# Patient Record
Sex: Female | Born: 1970 | Race: White | Hispanic: No | Marital: Married | Smoking: Former smoker
Health system: Southern US, Community
[De-identification: ages and names within clinical notes are randomized; demographics above are authoritative.]

## PROBLEM LIST (undated history)

## (undated) DIAGNOSIS — M719 Bursopathy, unspecified: Secondary | ICD-10-CM

## (undated) DIAGNOSIS — M519 Unspecified thoracic, thoracolumbar and lumbosacral intervertebral disc disorder: Secondary | ICD-10-CM

## (undated) HISTORY — DX: Bursopathy, unspecified: M71.9

## (undated) HISTORY — DX: Unspecified thoracic, thoracolumbar and lumbosacral intervertebral disc disorder: M51.9

---

## 2001-03-21 HISTORY — PX: DILATION AND CURETTAGE OF UTERUS: SHX78

## 2002-03-21 HISTORY — PX: LAPAROSCOPY: SHX197

## 2004-01-02 ENCOUNTER — Ambulatory Visit (HOSPITAL_COMMUNITY): Admission: RE | Admit: 2004-01-02 | Discharge: 2004-01-02 | Payer: Self-pay | Admitting: Gynecology

## 2004-02-20 ENCOUNTER — Ambulatory Visit (HOSPITAL_COMMUNITY): Admission: RE | Admit: 2004-02-20 | Discharge: 2004-02-20 | Payer: Self-pay | Admitting: Gynecology

## 2004-05-17 ENCOUNTER — Ambulatory Visit (HOSPITAL_COMMUNITY): Admission: RE | Admit: 2004-05-17 | Discharge: 2004-05-17 | Payer: Self-pay | Admitting: Gynecology

## 2004-05-27 ENCOUNTER — Inpatient Hospital Stay (HOSPITAL_COMMUNITY): Admission: AD | Admit: 2004-05-27 | Discharge: 2004-05-30 | Payer: Self-pay | Admitting: Gynecology

## 2004-05-31 ENCOUNTER — Encounter: Admission: RE | Admit: 2004-05-31 | Discharge: 2004-06-30 | Payer: Self-pay | Admitting: Gynecology

## 2004-07-01 ENCOUNTER — Encounter: Admission: RE | Admit: 2004-07-01 | Discharge: 2004-07-14 | Payer: Self-pay | Admitting: Gynecology

## 2004-12-06 IMAGING — US US OB FOLLOW-UP
1 series · 13 of 28 positions shown · non-contrast
Comparison: none

CLINICAL DATA: 25 week 2 day assigned gestational age.  Incomplete anatomic evaluation.  Follow-up fetal anatomy and evaluate growth.

[Series 1: us ob follow-up · 0.33mm/px · 13 of 62 slices shown]
[im 3/62]
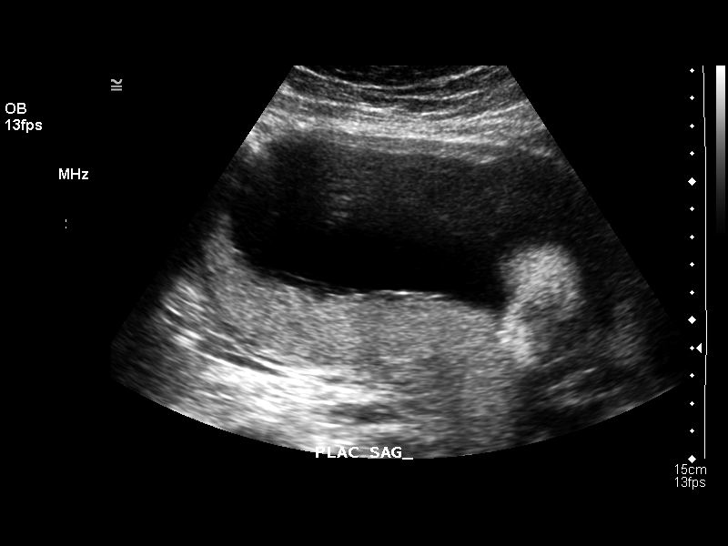
[im 7/62]
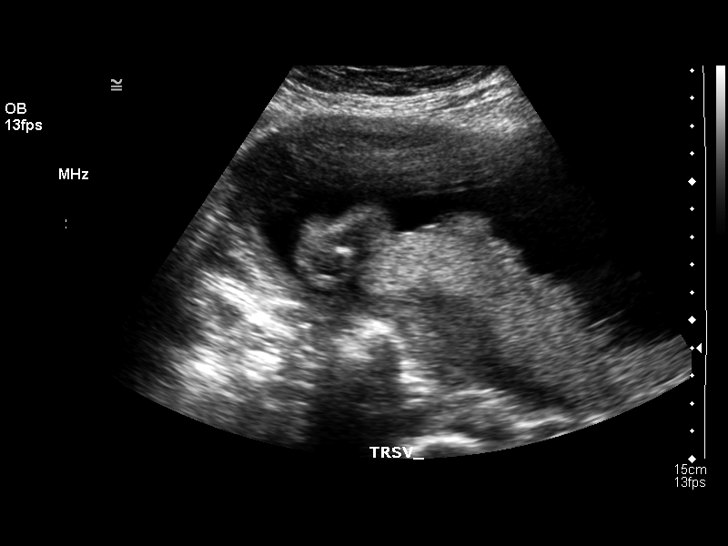
[im 12/62]
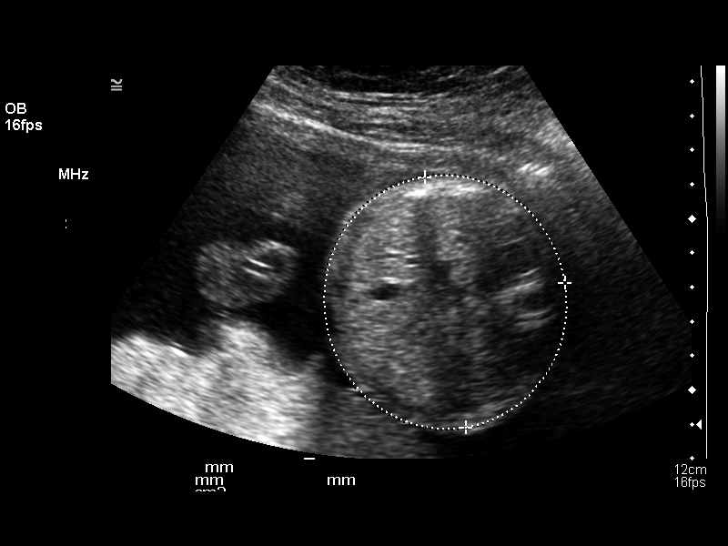
[im 16/62]
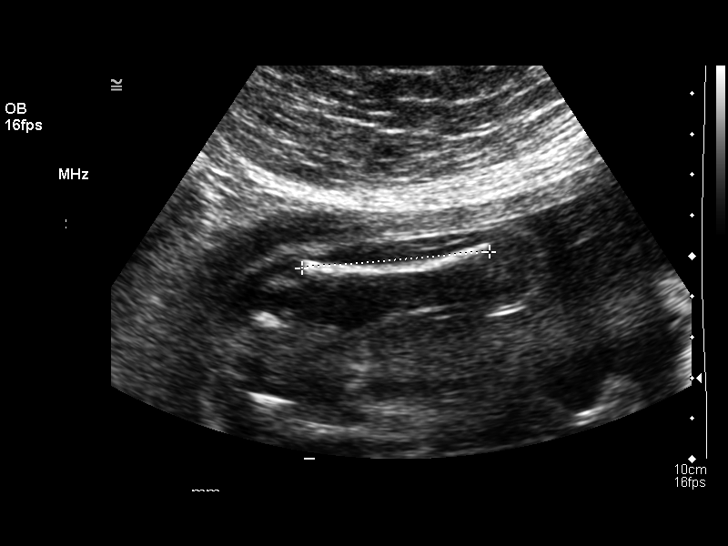
[im 21/62]
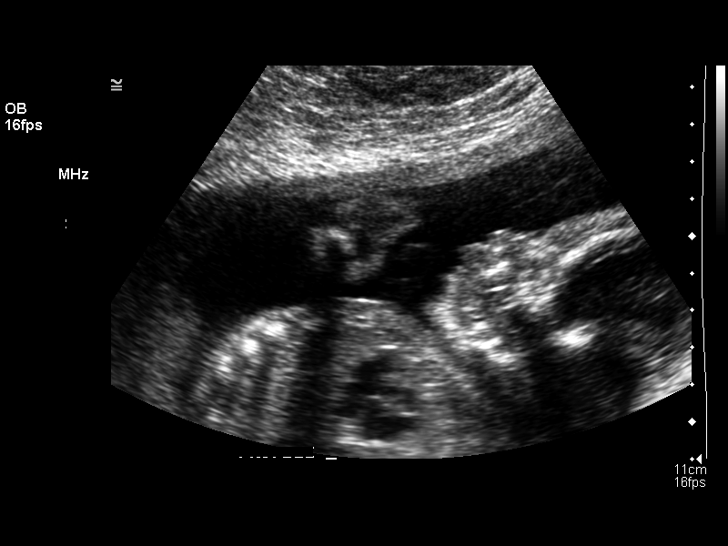
[im 25/62]
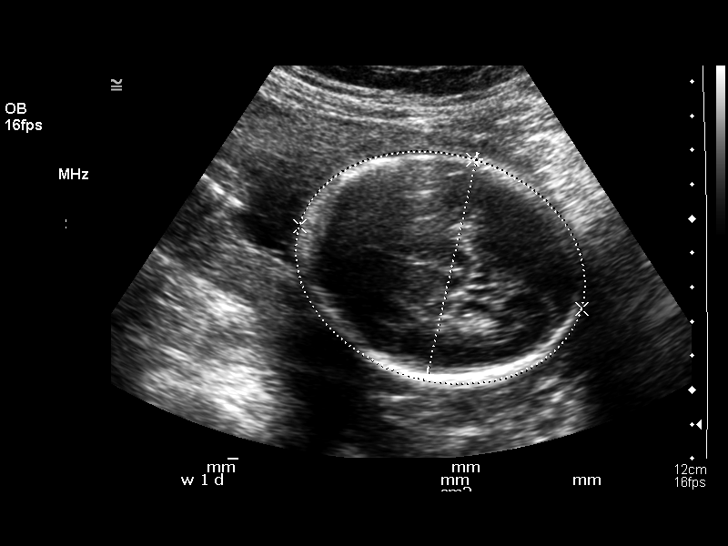
[im 32/62]
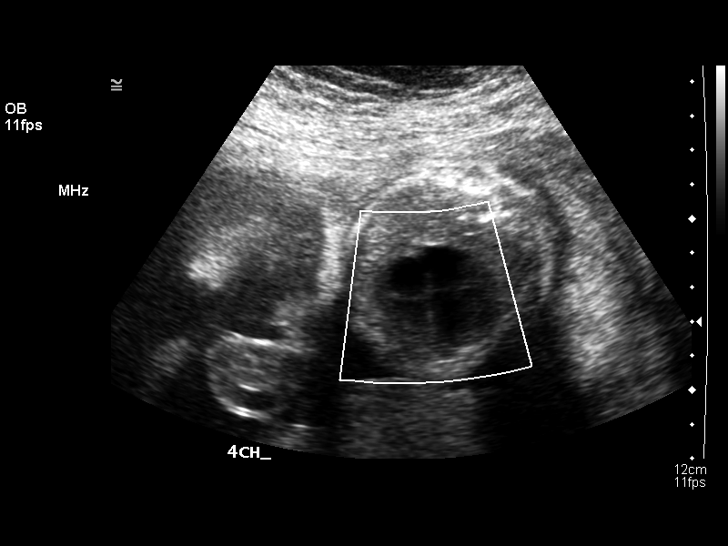
[im 37/62]
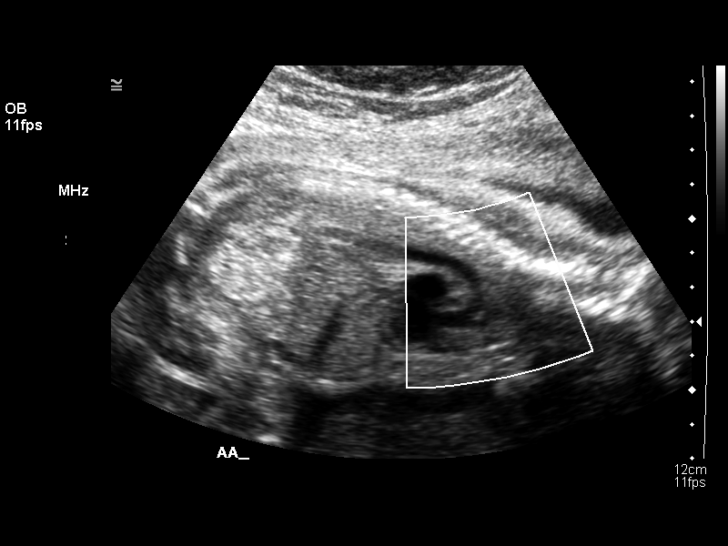
[im 41/62]
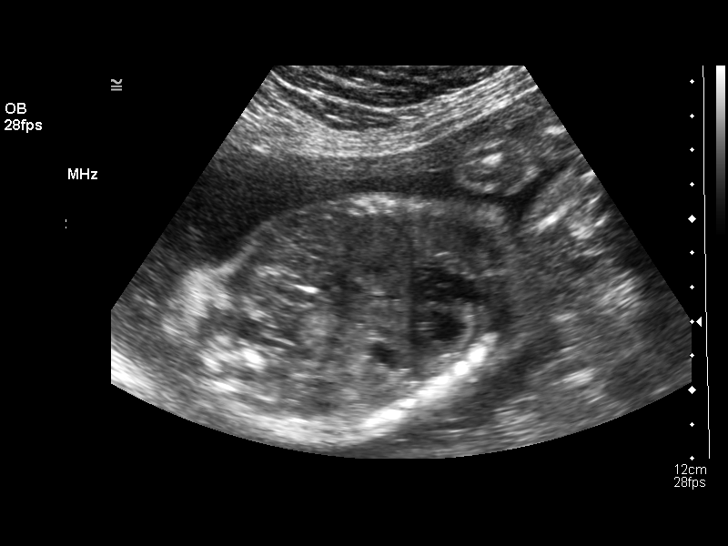
[im 46/62]
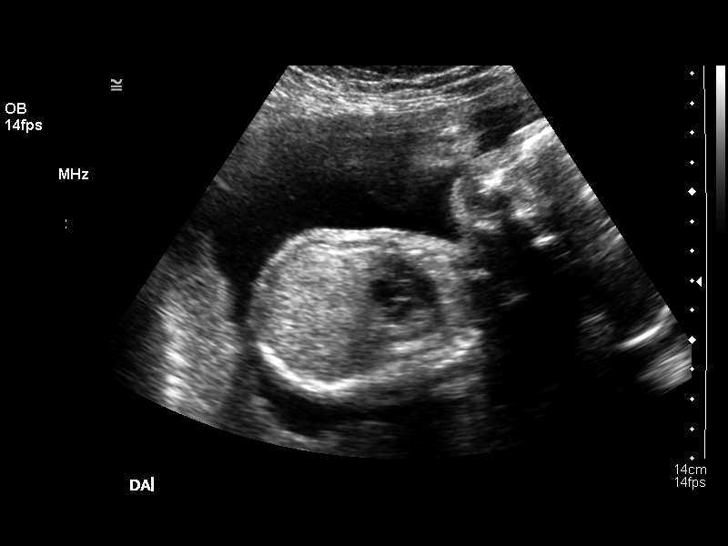
[im 50/62]
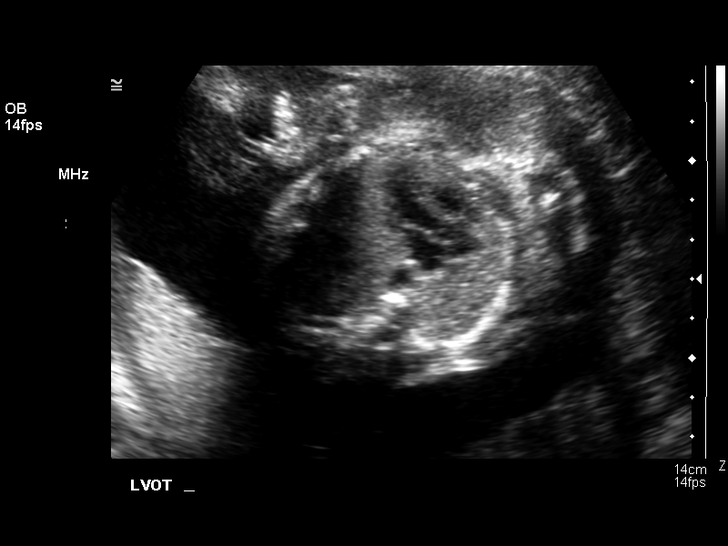
[im 55/62]
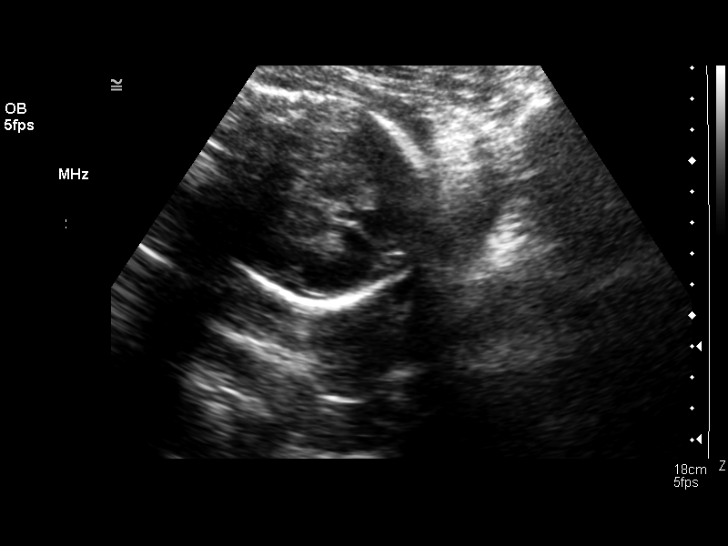
[im 59/62]
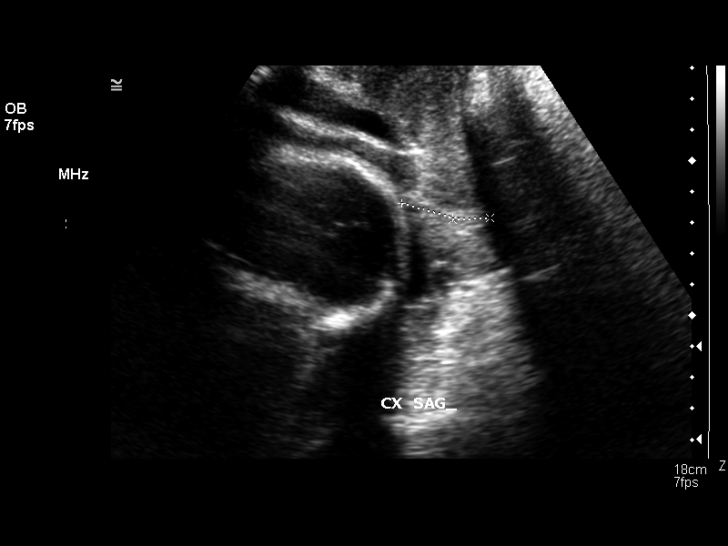

[13 of 28 positions shown; findings below may reference images not displayed]

OBSTETRICAL ULTRASOUND RE-EVALUATION:
 Number of Fetuses:  1
 Heart Rate:  126
 Movement:  Yes
 Breathing:  Yes
 Presentation:  Cephalic
 Placental Location:  Posterior
 Grade:  I
 Previa:  No
 Amniotic Fluid (subjective):  Normal
 Amniotic Fluid (objective):  5.0 cm Vertical pocket 

 FETAL BIOMETRY
 BPD:  6.4 cm  26 w 1 d 
 HC:  23.5 cm  25 w 4 d
 AC:  22.9 cm   27 w 2 d
 FL:   4.5 cm   25 w 2 d

 Mean GA:  26 w 1 d
 Assigned GA:  25 w 2 d

 EFW:  923 g (H) 90th – 95th%ile ( 889 – 968 g) for 25 wks

 FETAL ANATOMY
 Lateral Ventricles:  Visualized 
 Thalami/CSP:  Previously seen 
 Posterior Fossa:  Previously seen 
 Nuchal Region:  Previously seen 
 Spine:  Previously seen 
 4 Chamber Heart on Left:  Visualized 
 Stomach on Left:  Visualized 
 3 Vessel Cord:  Previously seen 
 Cord Insertion Site:  Previously seen 
 Kidneys:  Visualized 
 Bladder:  Visualized 
 Extremities:  Previously seen 

 ADDITIONAL ANATOMY VISUALIZED:  LVOT, RVOT, profile, ductal arch, and male genitalia.  

 MATERNAL UTERINE AND ADNEXAL FINDINGS
 Cervix:  3.0 cm Translabially
IMPRESSION: 1.  Assigned gestational age is currently 25 weeks 2 days.  Appropriate fetal growth is seen, with EFW currently at 90th – 95th percentile.  Consider further ultrasound follow-up of fetal growth.  
 2.  Four chamber heart and cardiac outflow tracts were visualized on today’s exam.  No fetal abnormalities are identified.  
 3.  Normal amniotic fluid volume and normal cervical length.

## 2005-05-13 DIAGNOSIS — F329 Major depressive disorder, single episode, unspecified: Secondary | ICD-10-CM | POA: Insufficient documentation

## 2005-05-13 DIAGNOSIS — R5381 Other malaise: Secondary | ICD-10-CM | POA: Insufficient documentation

## 2005-05-13 DIAGNOSIS — F32A Depression, unspecified: Secondary | ICD-10-CM | POA: Insufficient documentation

## 2005-08-24 ENCOUNTER — Ambulatory Visit: Payer: Self-pay | Admitting: Gynecology

## 2005-11-28 ENCOUNTER — Encounter (INDEPENDENT_AMBULATORY_CARE_PROVIDER_SITE_OTHER): Payer: Self-pay | Admitting: Gynecology

## 2005-11-28 ENCOUNTER — Ambulatory Visit: Payer: Self-pay | Admitting: Gynecology

## 2006-05-18 ENCOUNTER — Ambulatory Visit: Payer: Self-pay | Admitting: Gynecology

## 2006-09-18 ENCOUNTER — Ambulatory Visit: Payer: Self-pay | Admitting: Family Medicine

## 2006-10-24 ENCOUNTER — Ambulatory Visit: Payer: Self-pay | Admitting: Neurosurgery

## 2006-11-13 ENCOUNTER — Ambulatory Visit: Payer: Self-pay | Admitting: Gynecology

## 2007-01-01 ENCOUNTER — Ambulatory Visit: Payer: Self-pay | Admitting: Gynecology

## 2007-03-12 ENCOUNTER — Inpatient Hospital Stay (HOSPITAL_COMMUNITY): Admission: AD | Admit: 2007-03-12 | Discharge: 2007-03-12 | Payer: Self-pay | Admitting: Gynecology

## 2007-04-23 ENCOUNTER — Ambulatory Visit: Payer: Self-pay | Admitting: Gynecology

## 2008-05-27 ENCOUNTER — Ambulatory Visit: Payer: Self-pay | Admitting: Obstetrics & Gynecology

## 2010-04-11 ENCOUNTER — Encounter: Payer: Self-pay | Admitting: Gynecology

## 2010-04-27 ENCOUNTER — Ambulatory Visit: Payer: Self-pay

## 2010-08-03 NOTE — Assessment & Plan Note (Signed)
NAMESEHAM, GARDENHIRE                 ACCOUNT NO.:  [id number]   MEDICAL RECORD NO.:  [id number]          PATIENT TYPE:  POB   LOCATION:  [facility]         FACILITY:  [facility]   PHYSICIAN:  Scheryl Darter, MD       DATE OF BIRTH:  1971-01-13   DATE OF SERVICE:  05/27/2008                                  CLINIC NOTE   The patient comes on March, 9, 2010.  The patient presents for yearly  exam.   Ms. Schlemmer is a 40 year old white female gravida 1, para 1 with a  history of irregular periods, infertility, polycystic ovary syndrome.  She is currently on no contraception.  She required in vitro  fertilization in order to conceive.  Her son is now 33 years old.  She  has not desired infertility treatment again.  She has been amenorrheic  since March 22, 2007, although she says that sometime in the last year  she had some slight spotting.  She asked today about a weight loss  medicine called Bontril that she has heard of.   PAST MEDICAL HISTORY:  1. Obesity.  2. Infertility with PCOS.   PAST SURGICAL HISTORY:  None.   MEDICATIONS:  None.   ALLERGIES:  SULFA.   SOCIAL HISTORY:  The patient is married.  She denies alcohol, tobacco,  or drug use.   FAMILY HISTORY:  Unremarkable.   REVIEW OF SYSTEMS:  Amenorrhea.  She denies urinary or vaginal symptoms.   PHYSICAL EXAMINATION:  GENERAL:  The patient is in no acute distress.  Normal affect.  VITAL SIGNS:  Weight is 221 pounds, height 5 feet 4 inches, BP 113/74,  pulse 96.  CHEST:  Clear.  HEART:  Regular rate and rhythm.  BREASTS:  Without masses.  ABDOMEN:  Mildly obese, soft, nontender.  No masses.  EXTREMITIES:  No swelling.  PELVIC:  External genitalia, vagina, and cervix are normal.  Pap was  obtained.  Uterus, normal size.  No adnexal mass or tenderness.   IMPRESSION:  History of chronic anovulation, currently anovulatory.  She  does not plan to conceive at this time.  She states that she had to have  in vitro  fertilization previously.   PLAN:  Offered cycle control with oral contraceptive pill and she would  like to start this.  I gave prescription for Ortho Tri-Cyclen.  She  plans to lose weight and she started to exercise more and improve her  diet.  I did not recommend being on weight loss medication.      Scheryl Darter, MD     JA/MEDQ  D:  05/27/2008  T:  05/28/2008  Job:  [id number]

## 2010-08-17 ENCOUNTER — Ambulatory Visit: Payer: Self-pay | Admitting: Family Medicine

## 2010-12-24 LAB — URINALYSIS, ROUTINE W REFLEX MICROSCOPIC
Bilirubin Urine: NEGATIVE
Glucose, UA: NEGATIVE
Hgb urine dipstick: NEGATIVE
Ketones, ur: NEGATIVE
Nitrite: NEGATIVE
Protein, ur: NEGATIVE
Specific Gravity, Urine: 1.01
Urobilinogen, UA: 0.2
pH: 6.5

## 2010-12-24 LAB — DIFFERENTIAL
Basophils Absolute: 0
Basophils Relative: 1
Eosinophils Absolute: 0.3
Eosinophils Relative: 5
Lymphocytes Relative: 31
Lymphs Abs: 1.9
Monocytes Absolute: 0.4
Monocytes Relative: 7
Neutro Abs: 3.5
Neutrophils Relative %: 57

## 2010-12-24 LAB — CBC
HCT: 31.5 — ABNORMAL LOW
Hemoglobin: 10.9 — ABNORMAL LOW
MCHC: 34.7
MCV: 88.6
Platelets: 307
RBC: 3.55 — ABNORMAL LOW
RDW: 12.8
WBC: 6.2

## 2010-12-24 LAB — WET PREP, GENITAL
Clue Cells Wet Prep HPF POC: NONE SEEN
Trich, Wet Prep: NONE SEEN
Yeast Wet Prep HPF POC: NONE SEEN

## 2010-12-24 LAB — POCT PREGNANCY, URINE
Operator id: 220991
Preg Test, Ur: NEGATIVE

## 2010-12-24 LAB — GC/CHLAMYDIA PROBE AMP, GENITAL
Chlamydia, DNA Probe: NEGATIVE
GC Probe Amp, Genital: NEGATIVE

## 2012-10-31 LAB — HM PAP SMEAR: HM Pap smear: NEGATIVE

## 2012-11-13 LAB — HM HIV SCREENING LAB: HM HIV SCREENING: NEGATIVE

## 2012-11-14 ENCOUNTER — Ambulatory Visit: Payer: Self-pay | Admitting: Obstetrics and Gynecology

## 2012-12-11 ENCOUNTER — Ambulatory Visit: Payer: Self-pay | Admitting: Obstetrics and Gynecology

## 2012-12-11 LAB — PREGNANCY, URINE: Pregnancy Test, Urine: NEGATIVE m[IU]/mL

## 2013-09-27 IMAGING — US ULTRASOUND CORE BIOPSY
1 series · 11 of 11 positions shown · non-contrast
Comparison: none

REASON FOR EXAM: rt thyroid nodule
COMMENTS:

[Series 1: ultrasound core biopsy · 0.08mm/px · 11 of 11 slices shown]
[im 1/11]
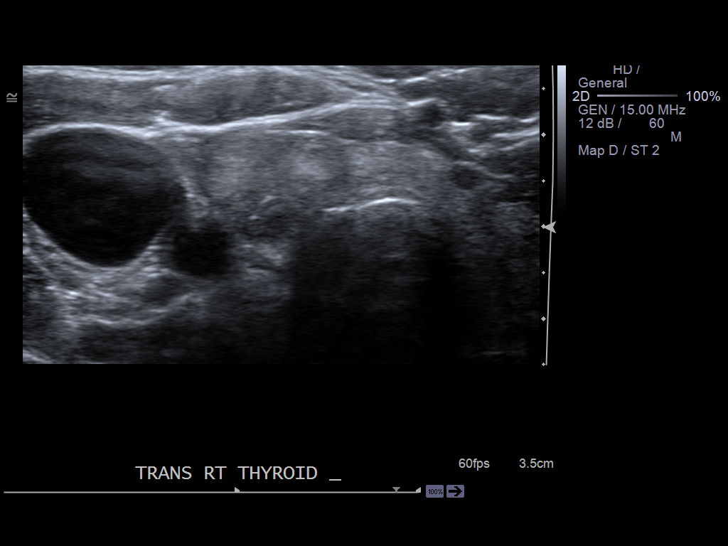
[im 2/11]
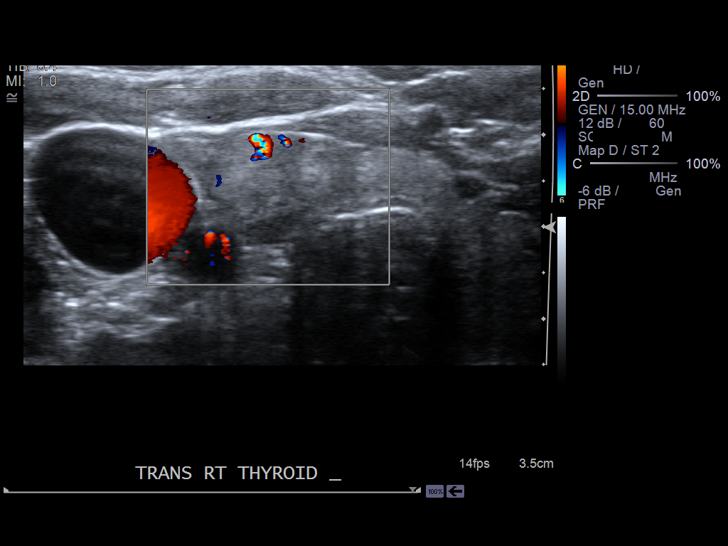
[im 3/11]
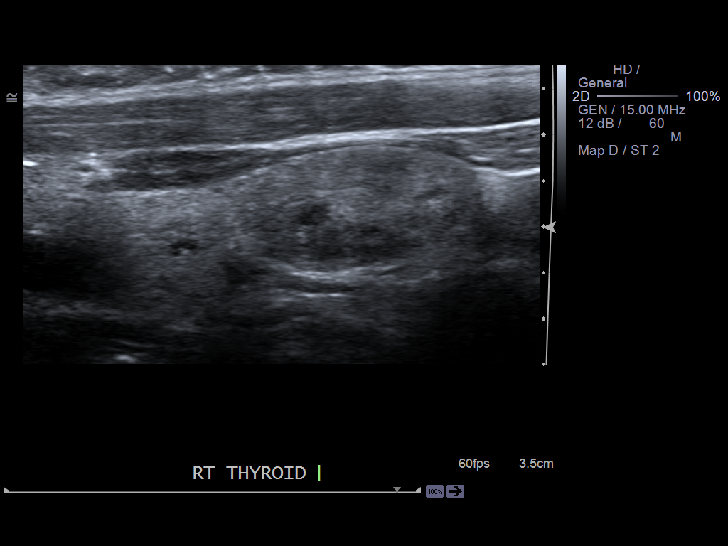
[im 4/11]
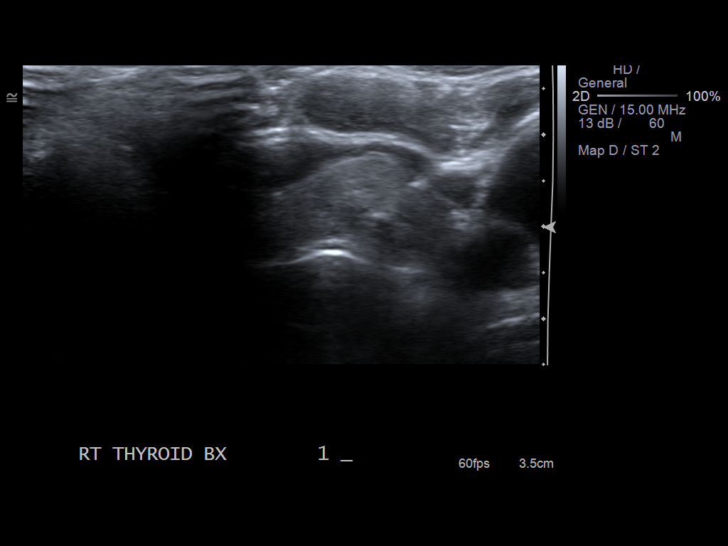
[im 5/11]
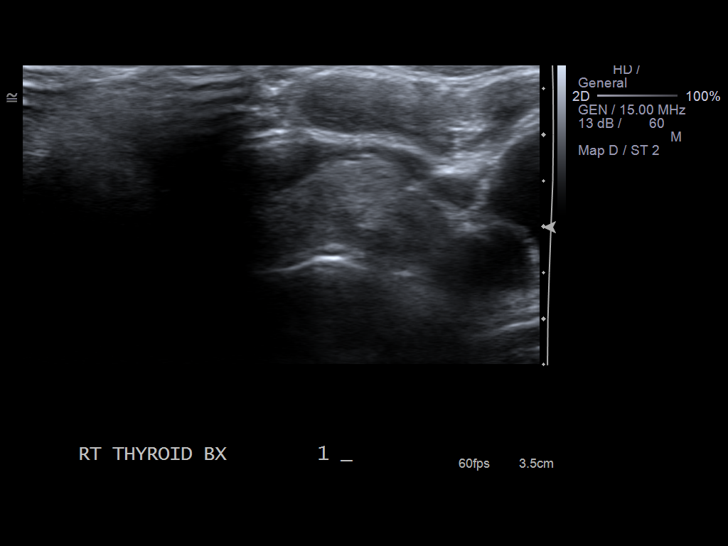
[im 6/11]
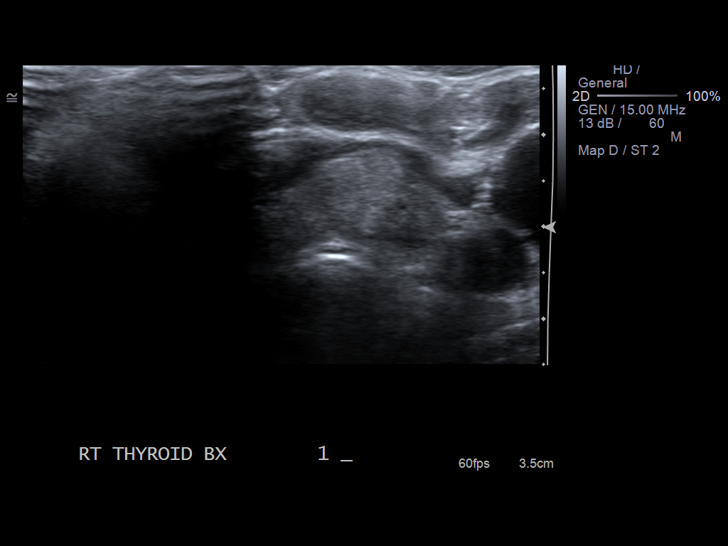
[im 7/11]
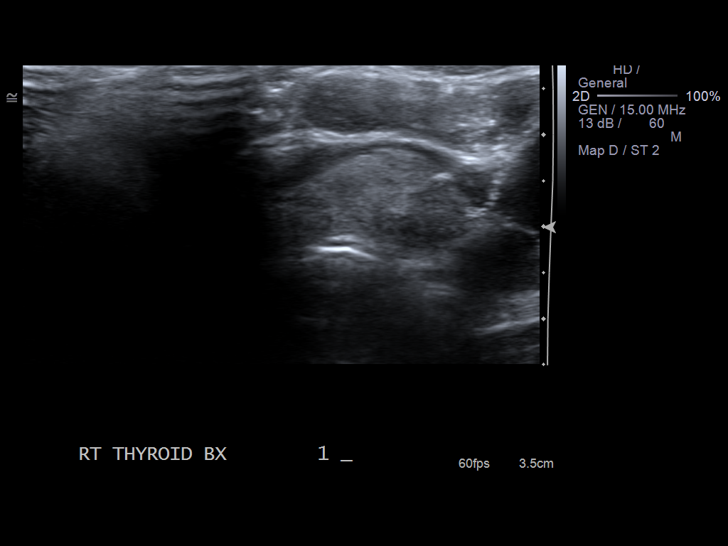
[im 8/11]
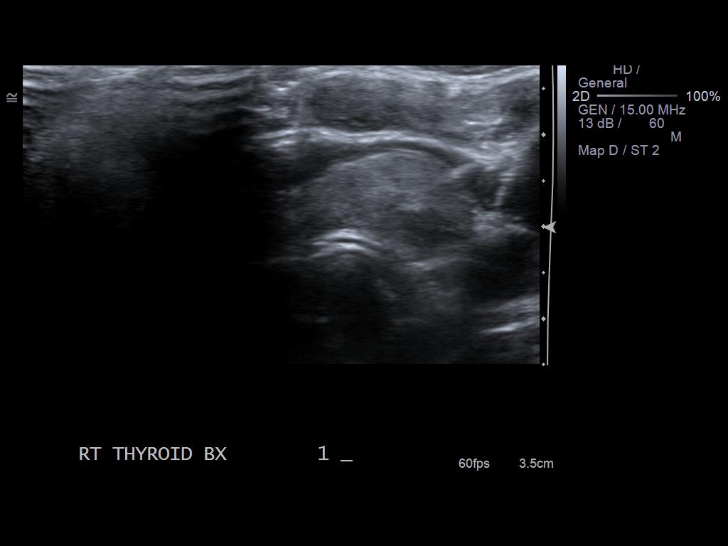
[im 9/11]
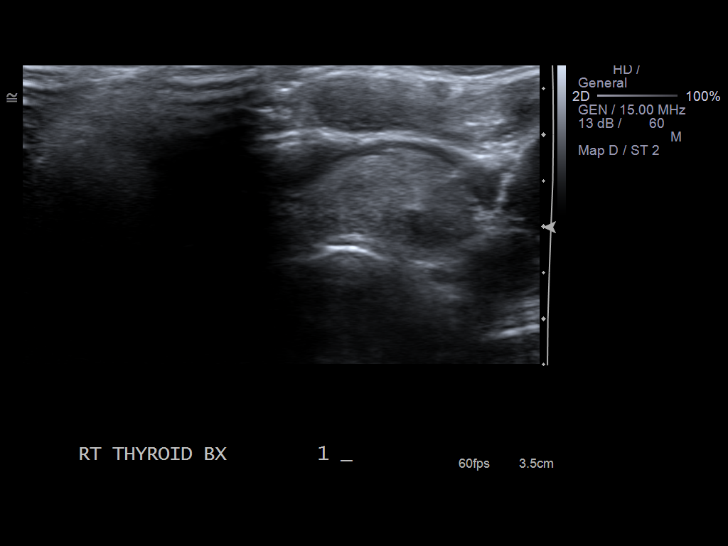
[im 10/11]
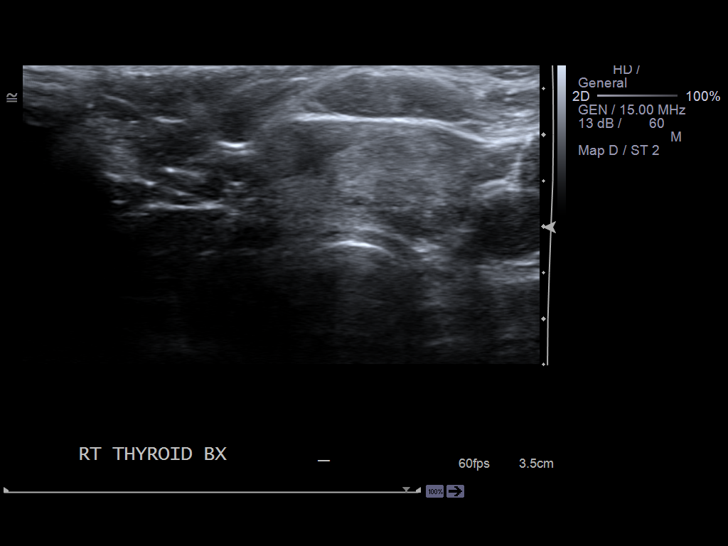
[im 11/11]
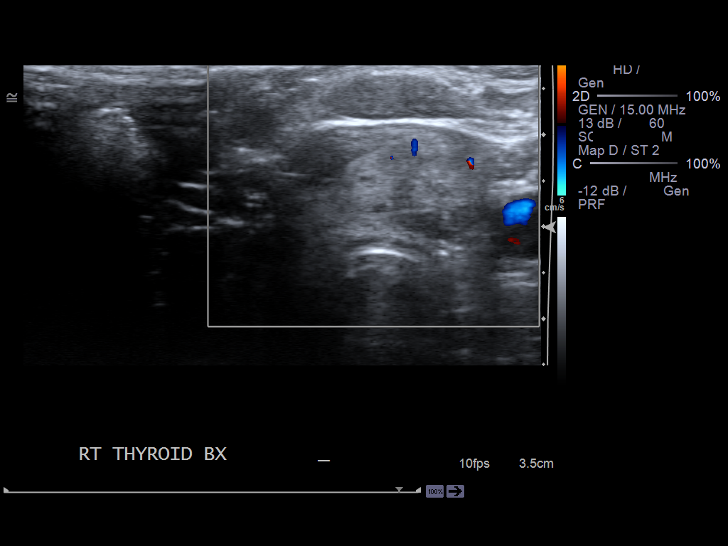

[11 of 11 positions shown; findings below may reference images not displayed]

PROCEDURE:     US  - US GUIDED BX/ASPIRATION NOT BR  - December 11, 2012  [DATE]

RESULT:

Procedure: The patient was informed of the risks and benefits of the
procedure and proper informed consent was obtained. The patient was brought
to the Ultrasound Suite and placed in a supine position. The thyroid was
evaluated and a dominant nodule is appreciated within the right lobe of the
thyroid. A proper entry site for ultrasound-guided biopsy was established.
The overlying soft tissues were then prepped and draped in the usual sterile
fashion. Utilizing 4 mL of 1% Xylocaine without epinephrine the overlying
soft tissues were anesthetized. A pass was made into the dominant nodule in
the thyroid with a 23-gauge heparinized thyroid biopsy needle. The sample
was submitted to Pathology for preliminary evaluation and deemed adequate.
There is no evidence of perilesional active hemorrhage nor perilesional free
fluid or loculated fluid collections. The patient otherwise tolerated the
procedure without complications and was monitored in Specials recovery.
IMPRESSION: 1. Ultrasound-guided thyroid biopsy as described above. The patient
tolerated the procedure without complications.

## 2014-04-22 ENCOUNTER — Ambulatory Visit: Payer: Self-pay | Admitting: Obstetrics and Gynecology

## 2014-05-11 ENCOUNTER — Emergency Department: Payer: Self-pay | Admitting: Physician Assistant

## 2014-08-11 DIAGNOSIS — M79609 Pain in unspecified limb: Secondary | ICD-10-CM | POA: Insufficient documentation

## 2014-08-11 DIAGNOSIS — R1031 Right lower quadrant pain: Secondary | ICD-10-CM | POA: Insufficient documentation

## 2014-08-11 DIAGNOSIS — R7309 Other abnormal glucose: Secondary | ICD-10-CM | POA: Insufficient documentation

## 2014-09-18 ENCOUNTER — Ambulatory Visit (INDEPENDENT_AMBULATORY_CARE_PROVIDER_SITE_OTHER): Payer: 59 | Admitting: Physician Assistant

## 2014-09-18 ENCOUNTER — Encounter: Payer: Self-pay | Admitting: Physician Assistant

## 2014-09-18 VITALS — BP 108/76 | HR 76 | Temp 98.5°F | Resp 16 | Wt 221.2 lb

## 2014-09-18 DIAGNOSIS — M6248 Contracture of muscle, other site: Secondary | ICD-10-CM | POA: Diagnosis not present

## 2014-09-18 DIAGNOSIS — M62838 Other muscle spasm: Secondary | ICD-10-CM

## 2014-09-18 MED ORDER — CYCLOBENZAPRINE HCL 10 MG PO TABS: 10.0000 mg | ORAL_TABLET | Freq: Three times a day (TID) | ORAL | Status: DC | PRN

## 2014-09-18 NOTE — Patient Instructions (Signed)

## 2014-09-18 NOTE — Progress Notes (Signed)
   Subjective:    Patient ID: Terri Saunders, female    DOB: [birth year], 44 y.o.   MRN: [id number]  Neck Pain  This is a new problem. The current episode started in the past 7 days (slept in hospital on Tuesday night (09/16/14) while her husband was hopsitalized and woke up with sore neclk). The problem occurs constantly. The problem has been gradually worsening. The pain is associated with a sleep position. The pain is present in the left side (posterior). The quality of the pain is described as aching. The pain is at a severity of 5/10. The pain is moderate. The symptoms are aggravated by twisting and stress. The pain is worse during the day. Stiffness is present in the morning. Pertinent negatives include no chest pain, fever, headaches, leg pain, numbness, pain with swallowing, paresis, photophobia, syncope, tingling, trouble swallowing, visual change, weakness or weight loss. She has tried NSAIDs and ice (blue emu rub, TENS unit, massage) for the symptoms. The treatment provided mild relief.      Review of Systems  Constitutional: Negative for fever, chills, weight loss, diaphoresis and fatigue.  HENT: Negative for trouble swallowing.   Eyes: Negative for photophobia.  Respiratory: Negative for shortness of breath.   Cardiovascular: Negative for chest pain and syncope.  Gastrointestinal: Negative for nausea and vomiting.  Musculoskeletal: Positive for neck pain and neck stiffness. Negative for back pain and gait problem.  Skin: Negative for rash.  Neurological: Negative for dizziness, tingling, syncope, weakness, numbness and headaches.  Hematological: Negative for adenopathy. Does not bruise/bleed easily.       Objective:   Physical Exam  Constitutional: She appears well-developed and well-nourished. No distress.  Neck: Trachea normal. Neck supple. No JVD present. Muscular tenderness (tenderness and spasm noted in left upper trapezius and left paraspinal muscle) present. No spinous  process tenderness present. No rigidity. Decreased range of motion (decreased flexion, lateral flexion bilaterally, and roation bilaterally) present. No tracheal deviation, no edema and no erythema present. No thyroid mass and no thyromegaly present.  Lymphadenopathy:    She has no cervical adenopathy.  Skin: She is not diaphoretic.  Vitals reviewed.         Assessment & Plan:  1. Muscle spasms of neck She is to take Ibuprofen 800mg  TID while awake for 2 weeks.  Continue heating pad to affected area.  Advised to stretch while she has heating pad over her neck.  She may continue massage to the area.  Muscle relaxer Rx given.  Advised to take at bedtime initially to see how drowsy it makes her.  If drowsiness occurs she is to take only at bedtime or when she will be home.  If minimal to no dorwsiness then she may take every 8 hrs prn for spasm.  She is to call if she develops numbness/tingling/weakness into left hand ir if symptoms do not improve. - cyclobenzaprine (FLEXERIL) 10 MG tablet; Take 1 tablet (10 mg total) by mouth 3 (three) times daily as needed for muscle spasms.  Dispense: 30 tablet; Refill: 0

## 2014-10-22 ENCOUNTER — Ambulatory Visit (INDEPENDENT_AMBULATORY_CARE_PROVIDER_SITE_OTHER): Payer: 59 | Admitting: Physician Assistant

## 2014-10-22 ENCOUNTER — Encounter: Payer: Self-pay | Admitting: Physician Assistant

## 2014-10-22 VITALS — BP 136/80 | HR 76 | Temp 98.5°F | Resp 16 | Wt 224.8 lb

## 2014-10-22 DIAGNOSIS — M545 Low back pain, unspecified: Secondary | ICD-10-CM

## 2014-10-22 MED ORDER — HYDROCODONE-ACETAMINOPHEN 5-325 MG PO TABS: 1.0000 | ORAL_TABLET | Freq: Four times a day (QID) | ORAL | Status: DC | PRN

## 2014-10-22 MED ORDER — PREDNISONE 20 MG PO TABS: ORAL_TABLET | ORAL | Status: DC

## 2014-10-22 NOTE — Patient Instructions (Signed)
Back Pain, Adult °Low back pain is very common. About 1 in 5 people have back pain. The cause of low back pain is rarely dangerous. The pain often gets better over time. About half of people with a sudden onset of back pain feel better in just 2 weeks. About 8 in 10 people feel better by 6 weeks.  °CAUSES °Some common causes of back pain include: °· Strain of the muscles or ligaments supporting the spine. °· Wear and tear (degeneration) of the spinal discs. °· Arthritis. °· Direct injury to the back. °DIAGNOSIS °Most of the time, the direct cause of low back pain is not known. However, back pain can be treated effectively even when the exact cause of the pain is unknown. Answering your caregiver's questions about your overall health and symptoms is one of the most accurate ways to make sure the cause of your pain is not dangerous. If your caregiver needs more information, he or she may order lab work or imaging tests (X-rays or MRIs). However, even if imaging tests show changes in your back, this usually does not require surgery. °HOME CARE INSTRUCTIONS °For many people, back pain returns. Since low back pain is rarely dangerous, it is often a condition that people can learn to manage on their own.  °· Remain active. It is stressful on the back to sit or stand in one place. Do not sit, drive, or stand in one place for more than 30 minutes at a time. Take short walks on level surfaces as soon as pain allows. Try to increase the length of time you walk each day. °· Do not stay in bed. Resting more than 1 or 2 days can delay your recovery. °· Do not avoid exercise or work. Your body is made to move. It is not dangerous to be active, even though your back may hurt. Your back will likely heal faster if you return to being active before your pain is gone. °· Pay attention to your body when you  bend and lift. Many people have less discomfort when lifting if they bend their knees, keep the load close to their bodies, and  avoid twisting. Often, the most comfortable positions are those that put less stress on your recovering back. °· Find a comfortable position to sleep. Use a firm mattress and lie on your side with your knees slightly bent. If you lie on your back, put a pillow under your knees. °· Only take over-the-counter or prescription medicines as directed by your caregiver. Over-the-counter medicines to reduce pain and inflammation are often the most helpful. Your caregiver may prescribe muscle relaxant drugs. These medicines help dull your pain so you can more quickly return to your normal activities and healthy exercise. °· Put ice on the injured area. °¨ Put ice in a plastic bag. °¨ Place a towel between your skin and the bag. °¨ Leave the ice on for 15-20 minutes, 03-04 times a day for the first 2 to 3 days. After that, ice and heat may be alternated to reduce pain and spasms. °· Ask your caregiver about trying back exercises and gentle massage. This may be of some benefit. °· Avoid feeling anxious or stressed. Stress increases muscle tension and can worsen back pain. It is important to recognize when you are anxious or stressed and learn ways to manage it. Exercise is a great option. °SEEK MEDICAL CARE IF: °· You have pain that is not relieved with rest or medicine. °· You have pain that does not improve in 1 week. °· You have new symptoms. °· You are generally not feeling well. °SEEK   IMMEDIATE MEDICAL CARE IF:  °· You have pain that radiates from your back into your legs. °· You develop new bowel or bladder control problems. °· You have unusual weakness or numbness in your arms or legs. °· You develop nausea or vomiting. °· You develop abdominal pain. °· You feel faint. °Document Released: 03/07/2005 Document Revised: 09/06/2011 Document Reviewed: 07/09/2013 °Exit[employer]® Patient Information ©2015 [employer]. This information is not intended to replace advice given to you by your health care provider. Make sure you  discuss any questions you have with your health care provider. ° °Back Exercises °Back exercises help treat and prevent back injuries. The goal of back exercises is to increase the strength of your abdominal and back muscles and the flexibility of your back. These exercises should be started when you no longer have back pain. Back exercises include: °· Pelvic Tilt. Lie on your back with your knees bent. Tilt your pelvis until the lower part of your back is against the floor. Hold this position 5 to 10 sec and repeat 5 to 10 times. °· Knee to Chest. Pull first 1 knee up against your chest and hold for 20 to 30 seconds, repeat this with the other knee, and then both knees. This may be done with the other leg straight or bent, whichever feels better. °· Sit-Ups or Curl-Ups. Bend your knees 90 degrees. Start with tilting your pelvis, and do a partial, slow sit-up, lifting your trunk only 30 to 45 degrees off the floor. Take at least 2 to 3 seconds for each sit-up. Do not do sit-ups with your knees out straight. If partial sit-ups are difficult, simply do the above but with only tightening your abdominal muscles and holding it as directed. °· Hip-Lift. Lie on your back with your knees flexed 90 degrees. Push down with your feet and shoulders as you raise your hips a couple inches off the floor; hold for 10 seconds, repeat 5 to 10 times. °· Back arches. Lie on your stomach, propping yourself up on bent elbows. Slowly press on your hands, causing an arch in your low back. Repeat 3 to 5 times. Any initial stiffness and discomfort should lessen with repetition over time. °· Shoulder-Lifts. Lie face down with arms beside your body. Keep hips and torso pressed to floor as you slowly lift your head and shoulders off the floor. °Do not overdo your exercises, especially in the beginning. Exercises may cause you some mild back discomfort which lasts for a few minutes; however, if the pain is more severe, or lasts for more than 15  minutes, do not continue exercises until you see your caregiver. Improvement with exercise therapy for back problems is slow.  °See your caregivers for assistance with developing a proper back exercise program. °Document Released: 04/14/2004 Document Revised: 05/30/2011 Document Reviewed: 01/06/2011 °ExitCare® Patient Information ©2015 [employer]. This information is not intended to replace advice given to you by your health care provider. Make sure you discuss any questions you have with your health care provider. ° °

## 2014-10-22 NOTE — Progress Notes (Signed)
Subjective:    Patient ID: Terri Saunders, female    DOB: 04-17-1970, 44 y.o.   MRN: [id number]  Back Pain This is a new problem. The current episode started yesterday. The problem occurs constantly. The problem has been gradually worsening since onset. The pain is present in the lumbar spine. The quality of the pain is described as shooting and aching. The pain does not radiate. The pain is at a severity of 8/10 (constant). The pain is severe. The pain is the same all the time. The symptoms are aggravated by bending and twisting. Stiffness is present all day. Pertinent negatives include no abdominal pain, bladder incontinence, bowel incontinence, chest pain, dysuria, fever, headaches, leg pain, numbness, paresis, paresthesias, pelvic pain, perianal numbness, tingling, weakness or weight loss. Risk factors include lack of exercise, obesity and sedentary lifestyle. She has tried ice, NSAIDs, walking, muscle relaxant and bed rest for the symptoms. The treatment provided no relief.  She also complains of skin sensitivity to touch on her lower back.  She says if her husband rubs her back he cannot rub below her bra line.  She has very limited movement.    Review of Systems  Constitutional: Negative for fever, chills, weight loss, diaphoresis, appetite change and fatigue.  Respiratory: Negative for chest tightness, shortness of breath and wheezing.   Cardiovascular: Negative for chest pain and leg swelling.  Gastrointestinal: Negative for nausea, vomiting, abdominal pain, diarrhea, constipation, rectal pain and bowel incontinence.  Genitourinary: Negative for bladder incontinence, dysuria, urgency, frequency, flank pain, vaginal discharge, vaginal pain, menstrual problem and pelvic pain.  Musculoskeletal: Positive for back pain and gait problem (antalgic gait). Negative for joint swelling, neck pain and neck stiffness.  Neurological: Negative for dizziness, tingling, syncope, weakness, light-headedness,  numbness, headaches and paresthesias.       Objective:   Physical Exam  Constitutional: She appears well-developed and well-nourished. No distress.  Musculoskeletal:       Cervical back: Normal.       Thoracic back: She exhibits decreased range of motion, tenderness, bony tenderness (starting around t11-t12), pain and spasm. She exhibits no swelling.       Lumbar back: She exhibits decreased range of motion (in all motions), tenderness, bony tenderness (throughout), pain and spasm. She exhibits no swelling and no deformity.  Neurological: She is alert. She has normal reflexes. Coordination normal.  Skin: Skin is warm and dry. No rash noted. She is not diaphoretic.  Vitals reviewed.         Assessment & Plan:  1. Bilateral low back pain without sciatica She has been taking IBU  TID since onset of back pain without any relief.  Will try prednisone as below.  She does have some cyclobenzaprine from when she had muscle spasm in her neck she has been taking at night.  Will add vicodin as below for better pain control.  Advised of drowsiness precautions.  She voiced understanding.  She does mention many years ago she had something similar but not as severe and had an xray that showed decreased disc space indicating possible bulging disc.  She is worried this has progressed or that something else is causing the skin sensitivity.  We will try to get approval for a lumbar MRI to better evaluate what may be causing the pain.  I did discuss with her that her weight causes a lot of stress on her lower back and once she starts improving from this acute phase it may be best to  also consider PT and weight loss. - predniSONE (DELTASONE) 20 MG tablet; Take 2 tabs PO q a.m. On day 1 & 2, take 1 tab PO q a.m. Daily days 3 & 4, take 1/2 tab PO q a.m. daily on day 5 & 6  Dispense: 7 tablet; Refill: 0 - HYDROcodone-acetaminophen (NORCO/VICODIN) 5-325 MG per tablet; Take 1-2 tablets by mouth every 6 (six)  hours as needed for moderate pain.  Dispense: 30 tablet; Refill: 0 - MR Lumbar Spine Wo Contrast; Future

## 2014-10-29 ENCOUNTER — Ambulatory Visit
Admission: RE | Admit: 2014-10-29 | Discharge: 2014-10-29 | Disposition: A | Discharge status: Home / Self Care | Payer: 59 | Source: Ambulatory Visit | Attending: Physician Assistant | Admitting: Physician Assistant

## 2014-10-29 DIAGNOSIS — M4807 Spinal stenosis, lumbosacral region: Secondary | ICD-10-CM | POA: Diagnosis not present

## 2014-10-29 DIAGNOSIS — M5127 Other intervertebral disc displacement, lumbosacral region: Secondary | ICD-10-CM | POA: Diagnosis not present

## 2014-10-29 DIAGNOSIS — M545 Low back pain, unspecified: Secondary | ICD-10-CM

## 2014-11-05 ENCOUNTER — Ambulatory Visit (INDEPENDENT_AMBULATORY_CARE_PROVIDER_SITE_OTHER): Payer: 59 | Admitting: Physician Assistant

## 2014-11-05 ENCOUNTER — Encounter: Payer: Self-pay | Admitting: Physician Assistant

## 2014-11-05 VITALS — BP 118/76 | HR 74 | Temp 98.7°F | Resp 16 | Wt 221.4 lb

## 2014-11-05 DIAGNOSIS — M5126 Other intervertebral disc displacement, lumbar region: Secondary | ICD-10-CM

## 2014-11-05 NOTE — Patient Instructions (Signed)

## 2014-11-05 NOTE — Progress Notes (Signed)
   Subjective:    Patient ID: Terri Saunders, female    DOB: 1971-01-12, 44 y.o.   MRN: [id number]  Back Pain This is a new problem. The current episode started 1 to 4 weeks ago. The problem occurs every several days. The problem has been gradually improving since onset. The pain is present in the lumbar spine. The quality of the pain is described as shooting and aching. The pain is at a severity of 3/10 (7/10 at its worst). The pain is moderate. The pain is worse during the night. The symptoms are aggravated by bending, twisting and sitting. Stiffness is present in the morning. Pertinent negatives include no abdominal pain, bladder incontinence, bowel incontinence, chest pain, dysuria, fever, headaches, leg pain, numbness, paresis, paresthesias, pelvic pain, perianal numbness, tingling, weakness or weight loss. Risk factors include lack of exercise, obesity and sedentary lifestyle. She has tried ice, NSAIDs, walking, muscle relaxant and bed rest for the symptoms. The treatment provided moderate relief.  She also complains of skin sensitivity to touch on her lower back.  She says if her husband rubs her back he cannot rub below her bra line.  Movement is improved today in the office.    Review of Systems  Constitutional: Negative for fever, chills, weight loss, diaphoresis, appetite change and fatigue.  Respiratory: Negative for chest tightness, shortness of breath and wheezing.   Cardiovascular: Negative for chest pain and leg swelling.  Gastrointestinal: Negative for nausea, vomiting, abdominal pain, diarrhea, constipation, rectal pain and bowel incontinence.  Genitourinary: Negative for bladder incontinence, dysuria, urgency, frequency, flank pain, vaginal discharge, vaginal pain, menstrual problem and pelvic pain.  Musculoskeletal: Positive for back pain and gait problem (antalgic gait). Negative for joint swelling, neck pain and neck stiffness.  Neurological: Negative for dizziness, tingling,  syncope, weakness, light-headedness, numbness, headaches and paresthesias.       Objective:   Physical Exam  Constitutional: She appears well-developed and well-nourished. No distress.  Musculoskeletal:       Cervical back: Normal.       Thoracic back: She exhibits tenderness and bony tenderness (starting around t11-t12). She exhibits normal range of motion, no swelling, no pain and no spasm.       Lumbar back: She exhibits tenderness and bony tenderness (throughout). She exhibits normal range of motion (in all motions), no swelling, no deformity, no pain and no spasm.  Neurological: She is alert. She has normal reflexes. Coordination normal.  Skin: Skin is warm and dry. No rash noted. She is not diaphoretic.  Vitals reviewed.  MRI examination of the lumbar spine revealed increased disc herniation at L5-S1 with S1 nerve root involvement.  It also shows rudimentary disc space between S1 and S2.       Assessment & Plan:  1. Lumbar herniated disc She is requesting referral for further evaluation of her lumbar spine as she has had "back issues for a while now."  I did offer PT and chiropractic therapy but she would like to await her appt with neurosurgery.  She does have continued pain and spasm but she states the spasm only occurs every 3 days or so and the cyclobenzaprine is controlling the spasm.  She does have continued tenderness to palpation along L4 through sacral spine.  I did show her some back exercises she may begin doing at home to hopefully prevent any further disc herniation.  She is to call the office if Sx persist or worsen. - Ambulatory referral to Neurosurgery

## 2014-11-10 ENCOUNTER — Telehealth: Payer: Self-pay | Admitting: Family Medicine

## 2014-11-13 ENCOUNTER — Ambulatory Visit (INDEPENDENT_AMBULATORY_CARE_PROVIDER_SITE_OTHER): Payer: 59 | Admitting: Obstetrics and Gynecology

## 2014-11-13 ENCOUNTER — Encounter: Payer: Self-pay | Admitting: Obstetrics and Gynecology

## 2014-11-13 VITALS — BP 123/86 | HR 84 | Ht 64.0 in | Wt 224.6 lb

## 2014-11-13 DIAGNOSIS — R1031 Right lower quadrant pain: Secondary | ICD-10-CM | POA: Diagnosis not present

## 2014-11-13 DIAGNOSIS — E669 Obesity, unspecified: Secondary | ICD-10-CM

## 2014-11-13 MED ORDER — CYANOCOBALAMIN 1000 MCG/ML IJ SOLN: 1000.0000 ug | Freq: Once | INTRAMUSCULAR | Status: DC

## 2014-11-13 MED ORDER — PHENTERMINE HCL 37.5 MG PO TABS: 37.5000 mg | ORAL_TABLET | Freq: Every day | ORAL | Status: DC

## 2014-11-13 NOTE — Progress Notes (Signed)
Subjective:     Patient ID: Terri Saunders, female   DOB: 11/29/1970, 44 y.o.   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  H/O ovarian cyst on left side  Also desires restart of adipex and B12 for weight loss. Took last year for 6 weeks but stopped due to starting allergy injections, worked well.  Review of Systems Intermittent sharp stabbing pains in RLQ/pelvis during sex regardless of position Denies any changes in BM or urination or last menses. Low back pain and muscle spasms relieved with current meds (Norco & Flexeril)      Objective:   Physical Exam A&O x4 Well groomed female in no current distress Abdomen soft without tenderness, pendulous and normal BS in all quadrants Pelvic exam: normal external genitalia, vulva, vagina, cervix, uterus and adnexa.     Assessment:     Obesity RLQ pain with intercourse     Plan:     Restart weight loss management today with 1st B12 injection given; to continue limited exercise at least 3 x week; RTC 4 weeks.  Counseled on typical causes of pelvic pains- including ovarian cyst, scar tissue, bowel changes, gas, UTI, and probable nerve pinching from bulging disc. Will watch at this time and discuss with neurosurgeon.  Terri Saunders, CNM

## 2014-12-09 ENCOUNTER — Ambulatory Visit (INDEPENDENT_AMBULATORY_CARE_PROVIDER_SITE_OTHER): Payer: 59 | Admitting: Obstetrics and Gynecology

## 2014-12-09 VITALS — BP 129/90 | HR 78 | Wt 216.5 lb

## 2014-12-09 DIAGNOSIS — E669 Obesity, unspecified: Secondary | ICD-10-CM

## 2014-12-09 NOTE — Progress Notes (Cosign Needed)
Pt is here for wt, bp check, her mother is a nurse and will be administering her b-12  Denies any s/e, she is doing well with weight loss  11/13/14- 224lb 12/10/14- 216.5lb

## 2015-01-06 ENCOUNTER — Ambulatory Visit (INDEPENDENT_AMBULATORY_CARE_PROVIDER_SITE_OTHER): Payer: 59 | Admitting: Obstetrics and Gynecology

## 2015-01-06 VITALS — BP 104/74 | HR 85 | Wt 216.3 lb

## 2015-01-06 DIAGNOSIS — E669 Obesity, unspecified: Secondary | ICD-10-CM

## 2015-01-06 MED ORDER — CYANOCOBALAMIN 1000 MCG/ML IJ SOLN
1000.0000 ug | Freq: Once | INTRAMUSCULAR | Status: AC
  Administered 2015-01-06: 1000 ug via INTRAMUSCULAR

## 2015-01-06 NOTE — Progress Notes (Cosign Needed)
Pt is here for wt, bp check, b-12 inj Per pt she was out of town this past weekend and did a lot of eating foods she really shouldn't have  12/09/14 wt-216lb 01/06/15 wt-216.3lb

## 2015-02-03 ENCOUNTER — Ambulatory Visit (INDEPENDENT_AMBULATORY_CARE_PROVIDER_SITE_OTHER): Payer: 59 | Admitting: Obstetrics and Gynecology

## 2015-02-03 VITALS — BP 129/85 | HR 94 | Ht 64.0 in | Wt 214.6 lb

## 2015-02-03 DIAGNOSIS — E669 Obesity, unspecified: Secondary | ICD-10-CM

## 2015-02-03 NOTE — Progress Notes (Cosign Needed)
Patient ID: Terri Saunders, female   DOB: 10-Nov-1970, 44 y.o.   MRN: [id number] Pt presents for weight, B/P, B-12 injection. No side effects of medication-Phentermine, or B-12.  Weight loss of 2 lbs. Encouraged eating healthy and exercise. Pt stated that [facility] nurse kept medication and handed her back rx container. When I checked there was not any there. Pt aware and I offered to give her B-12 in house but she said her mother is a Engineer, civil (consulting)nurse and said she could give it to her this time. I checked the pharmacy rx on epic and it doesn't appear she picked this up. Ordered was a 10ml vial. Explained to pt that 1ml is the normal but she will need to check with them.

## 2015-02-18 ENCOUNTER — Encounter: Payer: Self-pay | Admitting: Obstetrics and Gynecology

## 2015-02-19 ENCOUNTER — Ambulatory Visit (INDEPENDENT_AMBULATORY_CARE_PROVIDER_SITE_OTHER): Payer: 59 | Admitting: Obstetrics and Gynecology

## 2015-02-19 ENCOUNTER — Encounter: Payer: Self-pay | Admitting: Obstetrics and Gynecology

## 2015-02-19 VITALS — BP 138/92 | HR 98 | Ht 64.0 in | Wt 212.5 lb

## 2015-02-19 DIAGNOSIS — Z01419 Encounter for gynecological examination (general) (routine) without abnormal findings: Secondary | ICD-10-CM

## 2015-02-19 MED ORDER — CYANOCOBALAMIN 1000 MCG/ML IJ SOLN: 1000.0000 ug | Freq: Once | INTRAMUSCULAR | Status: DC

## 2015-02-19 MED ORDER — PHENTERMINE HCL 37.5 MG PO TABS: 37.5000 mg | ORAL_TABLET | Freq: Every day | ORAL | Status: DC

## 2015-02-19 NOTE — Progress Notes (Signed)
Subjective:   Terri Saunders is a 44 y.o. No obstetric history on file. Caucasian female here for a routine well-woman exam.  Patient's last menstrual period was 02/05/2015 (exact date).    Current complaints: occasional stress incontenance, desires restart of weight loss medication PCP: ?       Doesn't need labs  Social History: Sexual: heterosexual Marital Status: married Living situation: with family Occupation: unknown occupation Tobacco/alcohol: no tobacco use Illicit drugs: no history of illicit drug use  The following portions of the patient's history were reviewed and updated as appropriate: allergies, current medications, past family history, past medical history, past social history, past surgical history and problem list.  Past Medical History History reviewed. No pertinent past medical history.  Past Surgical History Past Surgical History  Procedure Laterality Date  . Dilation and curettage of uterus  2003    following a miscarriage  . Laparoscopy  2004    diagnostic for infertility work up    Gynecologic History No obstetric history on file.  Patient's last menstrual period was 02/05/2015 (exact date). Contraception: coitus interruptus Last Pap: 2015. Results were: normal Last mammogram: 2015. Results were: normal OBstetric History OB History  No data available    Current Medications Current Outpatient Prescriptions on File Prior to Visit  Medication Sig Dispense Refill  . cyclobenzaprine (FLEXERIL) 10 MG tablet Take 1 tablet (10 mg total) by mouth 3 (three) times daily as needed for muscle spasms. (Patient not taking: Reported on 02/19/2015) 30 tablet 0  . HYDROcodone-acetaminophen (NORCO/VICODIN) 5-325 MG per tablet Take 1-2 tablets by mouth every 6 (six) hours as needed for moderate pain. (Patient not taking: Reported on 11/13/2014) 30 tablet 0   No current facility-administered medications on file prior to visit.    Review of Systems Patient denies any  headaches, blurred vision, shortness of breath, chest pain, abdominal pain, problems with bowel movements, urination, or intercourse.  Objective:  BP 138/92 mmHg  Pulse 98  Ht 5\' 4"  (1.626 m)  Wt 212 lb 8 oz (96.389 kg)  BMI 36.46 kg/m2  LMP 02/05/2015 (Exact Date) Physical Exam  General:  Well developed, well nourished, no acute distress. She is alert and oriented x3. Skin:  Warm and dry Neck:  Midline trachea, no thyromegaly or nodules Cardiovascular: Regular rate and rhythm, no murmur heard Lungs:  Effort normal, all lung fields clear to auscultation bilaterally Breasts:  No dominant palpable mass, retraction, or nipple discharge Abdomen:  Soft, non tender, no hepatosplenomegaly or masses Pelvic:  External genitalia is normal in appearance.  The vagina is normal in appearance. The cervix is bulbous, no CMT.  Thin prep pap is not done. Uterus is felt to be normal size, shape, and contour.  No adnexal masses or tenderness noted. Rectal: Good sphincter tone, no polyps, or hemorrhoids felt.  Hemoccult: NA Extremities:  No swelling or varicosities noted Psych:  She has a normal mood and affect  Assessment:   Healthy well-woman exam Obesity Stress incontenence  Plan:  Pap and labs not indicated F/U 1 year for AE, or sooner if needed Mammogram scheduled meds refilled to restart weight loss   Melody Suzan NailerN Shambley, CNM

## 2015-02-19 NOTE — Patient Instructions (Signed)
  Place annual gynecologic exam patient instructions here.  Thank you for enrolling in MyChart. Please follow the instructions below to securely access your online medical record. MyChart allows you to send messages to your doctor, view your test results, manage appointments, and more.   How Do I Sign Up? 1. In your Internet browser, go to [employer] and enter [email or web address]. 2. Click on the Sign Up Now link in the Sign In box. You will see the New Member Sign Up page. 3. Enter your MyChart Access Code exactly as it appears below. You will not need to use this code after you've completed the sign-up process. If you do not sign up before the expiration date, you must request a new code.  MyChart Access Code: [email or web address] Expires: 04/19/2015  3:17 PM  4. Enter your Social Security Number ([id number]) and Date of Birth (mm/dd/yyyy) as indicated and click Submit. You will be taken to the next sign-up page. 5. Create a MyChart ID. This will be your MyChart login ID and cannot be changed, so think of one that is secure and easy to remember. 6. Create a MyChart password. You can change your password at any time. 7. Enter your Password Reset Question and Answer. This can be used at a later time if you forget your password.  8. Enter your e-mail address. You will receive e-mail notification when new information is available in MyChart. 9. Click Sign Up. You can now view your medical record.   Additional Information Remember, MyChart is NOT to be used for urgent needs. For medical emergencies, dial 911.

## 2015-03-20 ENCOUNTER — Ambulatory Visit (INDEPENDENT_AMBULATORY_CARE_PROVIDER_SITE_OTHER): Payer: 59 | Admitting: Obstetrics and Gynecology

## 2015-03-20 VITALS — BP 143/97 | HR 76 | Ht 64.0 in | Wt 218.0 lb

## 2015-03-20 DIAGNOSIS — E669 Obesity, unspecified: Secondary | ICD-10-CM

## 2015-03-20 MED ORDER — CYANOCOBALAMIN 1000 MCG/ML IJ SOLN
1000.0000 ug | Freq: Once | INTRAMUSCULAR | Status: AC
  Administered 2015-03-20: 1000 ug via INTRAMUSCULAR

## 2015-03-20 NOTE — Patient Instructions (Signed)
Pt to follow up as scheduled

## 2015-03-20 NOTE — Progress Notes (Signed)
Pt presents for B12 injection in addition to weight and adipex check. Pt denies side effects or complaints. Notes that she has had a hard time with appetite control over the holidays. Notes that she had fast food again today [facility]([employer]). Pt with a 4lb weight gain today. Pt also with elevated BP, informed providers nurse of this (A. Clontz) in addition advised pt to monitor BPs and contact office if readings are consistently high. Pt gave verbal understanding. Pt given 1mL injection of B12. Pt tolerated well. To return as scheduled.

## 2015-04-03 ENCOUNTER — Ambulatory Visit (INDEPENDENT_AMBULATORY_CARE_PROVIDER_SITE_OTHER): Payer: 59 | Admitting: Physician Assistant

## 2015-04-03 ENCOUNTER — Encounter: Payer: Self-pay | Admitting: Physician Assistant

## 2015-04-03 VITALS — BP 122/70 | HR 92 | Temp 99.0°F | Resp 16 | Wt 219.0 lb

## 2015-04-03 DIAGNOSIS — R05 Cough: Secondary | ICD-10-CM | POA: Diagnosis not present

## 2015-04-03 DIAGNOSIS — R062 Wheezing: Secondary | ICD-10-CM

## 2015-04-03 DIAGNOSIS — J40 Bronchitis, not specified as acute or chronic: Secondary | ICD-10-CM

## 2015-04-03 DIAGNOSIS — R059 Cough, unspecified: Secondary | ICD-10-CM

## 2015-04-03 MED ORDER — LEVALBUTEROL HCL 0.63 MG/3ML IN NEBU
0.6300 mg | INHALATION_SOLUTION | Freq: Once | RESPIRATORY_TRACT | Status: AC
  Administered 2015-04-03: 0.63 mg via RESPIRATORY_TRACT

## 2015-04-03 MED ORDER — ALBUTEROL SULFATE HFA 108 (90 BASE) MCG/ACT IN AERS: 1.0000 | INHALATION_SPRAY | RESPIRATORY_TRACT | Status: DC | PRN

## 2015-04-03 MED ORDER — BENZONATATE 200 MG PO CAPS: 200.0000 mg | ORAL_CAPSULE | Freq: Three times a day (TID) | ORAL | Status: DC | PRN

## 2015-04-03 MED ORDER — HYDROCODONE-HOMATROPINE 5-1.5 MG/5ML PO SYRP: 5.0000 mL | ORAL_SOLUTION | Freq: Three times a day (TID) | ORAL | Status: DC | PRN

## 2015-04-03 NOTE — Progress Notes (Signed)
Patient ID: Terri Saunders, female   DOB: [birth year], 45 y.o.   MRN: [id number]       Patient: Terri Saunders Female    DOB: Nov 14, 1970   45 y.o.   MRN: [id number] Visit Date: 04/03/2015  Today's Provider: Margaretann Loveless, PA-C   Chief Complaint  Patient presents with  . Cough    X 1 week.    Subjective:    Cough This is a new problem. The current episode started in the past 7 days. The problem has been gradually worsening. The cough is non-productive. Associated symptoms include ear congestion, nasal congestion, postnasal drip, rhinorrhea, shortness of breath and wheezing. Pertinent negatives include no chest pain, chills, ear pain, fever, headaches or sore throat. She has tried OTC cough suppressant and rest for the symptoms. The treatment provided no relief. Her past medical history is significant for environmental allergies. There is no history of asthma or pneumonia.  Patient reports that she has had trouble with her allergies for about 1 week now. She reports that she has been taking Claritin 10mg  OTC with no relief. She has also experienced chest tightness for the past 2 days with associated wheezing. Patient reports that she does get short of breath with exertion.      Allergies  Allergen Reactions  . Sulfa Antibiotics    Previous Medications   CYANOCOBALAMIN (,VITAMIN B-12,) 1000 MCG/ML INJECTION    Inject 1 mL (1,000 mcg total) into the muscle once.   CYCLOBENZAPRINE (FLEXERIL) 10 MG TABLET    Take 1 tablet (10 mg total) by mouth 3 (three) times daily as needed for muscle spasms.   HYDROCODONE-ACETAMINOPHEN (NORCO/VICODIN) 5-325 MG PER TABLET    Take 1-2 tablets by mouth every 6 (six) hours as needed for moderate pain.   PHENTERMINE (ADIPEX-P) 37.5 MG TABLET    Take 1 tablet (37.5 mg total) by mouth daily before breakfast.    Review of Systems  Constitutional: Positive for activity change and fatigue. Negative for fever and chills.  HENT: Positive for congestion,  postnasal drip, rhinorrhea, sinus pressure and sneezing. Negative for ear pain, sore throat, tinnitus, trouble swallowing and voice change.   Eyes: Negative.   Respiratory: Positive for cough, chest tightness, shortness of breath and wheezing.   Cardiovascular: Negative for chest pain.  Gastrointestinal: Negative for nausea, vomiting and abdominal pain.  Allergic/Immunologic: Positive for environmental allergies.  Neurological: Negative for dizziness and headaches.    Social History  Substance Use Topics  . Smoking status: Former Games developer  . Smokeless tobacco: Not on file  . Alcohol Use: No   Objective:   BP 122/70 mmHg  Pulse 92  Temp(Src) 99 F (37.2 C)  Resp 16  Wt 219 lb (99.338 kg)  SpO2 97%  Physical Exam  Constitutional: She appears well-developed and well-nourished. No distress.  HENT:  Head: Normocephalic and atraumatic.  Right Ear: Hearing, tympanic membrane, external ear and ear canal normal.  Left Ear: Hearing, tympanic membrane, external ear and ear canal normal.  Nose: Mucosal edema and rhinorrhea present. Right sinus exhibits no maxillary sinus tenderness and no frontal sinus tenderness. Left sinus exhibits no maxillary sinus tenderness and no frontal sinus tenderness.  Mouth/Throat: Uvula is midline, oropharynx is clear and moist and mucous membranes are normal. No oropharyngeal exudate, posterior oropharyngeal edema or posterior oropharyngeal erythema.  Eyes: Conjunctivae are normal. Pupils are equal, round, and reactive to light. Right eye exhibits no discharge. Left eye exhibits no discharge. No scleral icterus.  Neck:  Normal range of motion. Neck supple. No tracheal deviation present. No thyromegaly present.  Cardiovascular: Normal rate, regular rhythm and normal heart sounds.  Exam reveals no gallop and no friction rub.   No murmur heard. Pulmonary/Chest: Effort normal. No stridor. No respiratory distress. She has wheezes (throughout). She has no rales.    Lymphadenopathy:    She has no cervical adenopathy.  Skin: Skin is warm and dry. She is not diaphoretic.  Vitals reviewed.       Assessment & Plan:     1. Bronchitis Being that her symptoms have truly only been going on for a couple of days I did advise her that she most likely may have been reactive airway bronchitis secondary to an environmental allergen. I did give her a nebulizer treatment in the office with Xopenex as below and she did have much improvement in her lung sounds. She did still have some mild wheezing posttreatment but it was markedly improved from pre-treatment. She even stated she felt much relief in the chest tightness that she had been feeling as well. I will also give her an albuterol inhaler prescription for her to use every 4 hours as needed for wheezing or chest tightness and shortness of breath. I will also give her Hycodan cough syrup and Tessalon Perles for nighttime and daytime cough. She is to call the office if symptoms fail to improve or worsen. - albuterol (PROVENTIL HFA;VENTOLIN HFA) 108 (90 Base) MCG/ACT inhaler; Inhale 1-2 puffs into the lungs every 4 (four) hours as needed for wheezing or shortness of breath.  Dispense: 1 Inhaler; Refill: 0 - HYDROcodone-homatropine (HYCODAN) 5-1.5 MG/5ML syrup; Take 5 mLs by mouth every 8 (eight) hours as needed for cough.  Dispense: 120 mL; Refill: 0 - benzonatate (TESSALON) 200 MG capsule; Take 1 capsule (200 mg total) by mouth 3 (three) times daily as needed for cough.  Dispense: 20 capsule; Refill: 0 - levalbuterol (XOPENEX) nebulizer solution 0.63 mg; Take 3 mLs (0.63 mg total) by nebulization once.  2. Cough See above medical treatment plan. - HYDROcodone-homatropine (HYCODAN) 5-1.5 MG/5ML syrup; Take 5 mLs by mouth every 8 (eight) hours as needed for cough.  Dispense: 120 mL; Refill: 0 - benzonatate (TESSALON) 200 MG capsule; Take 1 capsule (200 mg total) by mouth 3 (three) times daily as needed for cough.   Dispense: 20 capsule; Refill: 0 - levalbuterol (XOPENEX) nebulizer solution 0.63 mg; Take 3 mLs (0.63 mg total) by nebulization once.  3. Wheezing See above medical treatment plan. - levalbuterol (XOPENEX) nebulizer solution 0.63 mg; Take 3 mLs (0.63 mg total) by nebulization once.       Margaretann LovelessJennifer M Burnette, PA-C  [facility]

## 2015-04-17 ENCOUNTER — Ambulatory Visit: Payer: 59

## 2015-04-17 ENCOUNTER — Ambulatory Visit: Payer: 59 | Admitting: Obstetrics and Gynecology

## 2015-04-17 VITALS — BP 125/85 | HR 98 | Ht 64.0 in | Wt 214.0 lb

## 2015-04-17 DIAGNOSIS — E669 Obesity, unspecified: Secondary | ICD-10-CM

## 2015-04-17 NOTE — Patient Instructions (Signed)
F/U in 1 month for wt, bp, b12

## 2015-04-17 NOTE — Progress Notes (Cosign Needed)
Pt presents for weight, bp, and b12 injection. Pt states that she forgot her b12, but will have her mother who is a nurse give it to her. Pt states that she has not been eating very much because she is too busy, but is staying hydrated. Advised pt on the need to eat 6 small meals. Pt gave verbal understanding. Pt today with weight loss of 4lbs.

## 2015-05-15 ENCOUNTER — Ambulatory Visit: Payer: 59

## 2015-10-16 ENCOUNTER — Ambulatory Visit (INDEPENDENT_AMBULATORY_CARE_PROVIDER_SITE_OTHER): Payer: 59 | Admitting: Obstetrics and Gynecology

## 2015-10-16 ENCOUNTER — Encounter: Payer: Self-pay | Admitting: Obstetrics and Gynecology

## 2015-10-16 VITALS — BP 121/89 | HR 73 | Ht 64.0 in | Wt 224.5 lb

## 2015-10-16 DIAGNOSIS — B373 Candidiasis of vulva and vagina: Secondary | ICD-10-CM

## 2015-10-16 DIAGNOSIS — B3731 Acute candidiasis of vulva and vagina: Secondary | ICD-10-CM

## 2015-10-16 DIAGNOSIS — N762 Acute vulvitis: Secondary | ICD-10-CM

## 2015-10-16 MED ORDER — FLUCONAZOLE 150 MG PO TABS: 150.0000 mg | ORAL_TABLET | Freq: Once | ORAL | 3 refills | Status: AC

## 2015-10-16 NOTE — Progress Notes (Signed)
Subjective:     Patient ID: Terri Saunders, female   DOB: 1971-01-28, 45 y.o.   MRN: [id number]  HPI Vulvar /groin rash irritation x 2 weeks, worse when sweating a lot or after showering/shaving  Review of Systems applied hydrocortisone cream with temporary relief    Objective:   Physical Exam A&O x4 Well groomed female  Blood pressure 121/89, pulse 73, height 5\' 4"  (1.626 m), weight 224 lb 8 oz (101.8 kg) Pelvic exam: VULVA: normal appearing vulva with no masses, tenderness or lesions, vulvar erythema along left majora ridge and at clitoral cleft, VAGINA: normal appearing vagina with normal color and discharge, no lesions, vaginal discharge - white, scant and thin, WET MOUNT done - results: negative for pathogens, normal epithelial cells, lactobacilli.    Assessment:     Vulvitis Mild yeast infection    Plan:     RX sent in for Dilflucan x 1 dose To apply vagisil externally prn RTC as needed.  Prather Failla Beacon, CNM

## 2015-10-26 NOTE — Telephone Encounter (Signed)
error 

## 2015-11-10 ENCOUNTER — Encounter: Payer: Self-pay | Admitting: Obstetrics and Gynecology

## 2015-11-10 ENCOUNTER — Ambulatory Visit (INDEPENDENT_AMBULATORY_CARE_PROVIDER_SITE_OTHER): Payer: 59 | Admitting: Obstetrics and Gynecology

## 2015-11-10 VITALS — BP 113/82 | HR 72 | Ht 64.0 in | Wt 221.5 lb

## 2015-11-10 DIAGNOSIS — E669 Obesity, unspecified: Secondary | ICD-10-CM

## 2015-11-10 DIAGNOSIS — R339 Retention of urine, unspecified: Secondary | ICD-10-CM | POA: Diagnosis not present

## 2015-11-10 DIAGNOSIS — L723 Sebaceous cyst: Secondary | ICD-10-CM | POA: Diagnosis not present

## 2015-11-10 MED ORDER — PHENTERMINE HCL 37.5 MG PO TABS: 37.5000 mg | ORAL_TABLET | Freq: Every day | ORAL | 2 refills | Status: DC

## 2015-11-10 MED ORDER — CYANOCOBALAMIN 1000 MCG/ML IJ SOLN: 1000.0000 ug | Freq: Once | INTRAMUSCULAR | 1 refills | Status: AC

## 2015-11-10 NOTE — Progress Notes (Signed)
Subjective:     Patient ID: Terri Saunders, femalHazle Quante   DOB: 1970-08-21, 45 y.o.   MRN: [id number]  HPI Reports small bump on inner labia x 3 days- no-tender. Also reports for the last 3-4 months, is not emptying bladder completely with dribbles noted when stands up from toilet, and will void moderate amount if she sits back down and tries to do more. Denies dysuria. Lastly desires restarting weight loss meds, although she feels like they did not suppress her appetite as much the last round.  Review of Systems See above    Objective:   Physical Exam A&O x4 Well groomed female Blood pressure 113/82, pulse 72, height 5\' 4"  (1.626 m), weight 221 lb 8 oz (100.5 kg), last menstrual period 10/28/2015. Pelvic exam: normal external genitalia, vulva, vagina, cervix, uterus and adnexa, VULVA: normal appearing vulva with no masses, tenderness or lesions, small sebaceous cyst noted on right vulvar fold-non-tender and movable.bladder without pain or defect on exam- good kegel's    Assessment:     sebaceous cyst of vulva Incomplete bladder emptying obesity    Plan:     Reassured of normal occurrence Referred to urology Restart weight loss meds, with increasing adipex dose to 1 1/2 tablets daily. B12 injection given today. RTC 4 week nurse visit.  Melody PavillionShambley, CNM

## 2016-01-12 ENCOUNTER — Telehealth: Payer: Self-pay | Admitting: Obstetrics and Gynecology

## 2016-01-12 NOTE — Telephone Encounter (Signed)
Spoke with pt she will see Amy 01/13/16

## 2016-01-12 NOTE — Telephone Encounter (Signed)
PT CALLED AND SHE WAS BACK IN AUG AND MELODY GAVE HER THE PHENTERMINE AND SHE CALLED THIS MORNING STATING THAT SHE NEEDED TO MAKE A WT BP AND B12, I WENT TO ASK HER WHEN SHE STARTED THE MEDS SO THAT I CAN SCHEDULE 4 WEEKS OUT AND SHE STATED SHE HAS NOT STARTED THE PHENTERMINE YET DUE TO LIFE HAPPENING, AND SHE THOUGHT SHE NEEDED TO COME IN NOW AND GET A WT BP AND B12 AGAIN BEFORE SHE STARTED, I WASN'T SURE AND JUST WANTED YOU TO CLARIFY WITH PT. LET ME KNOW IF I NEED TO SCHEDULE AN APPT FOR HER.

## 2016-02-10 ENCOUNTER — Ambulatory Visit (INDEPENDENT_AMBULATORY_CARE_PROVIDER_SITE_OTHER): Payer: 59 | Admitting: Obstetrics and Gynecology

## 2016-02-10 VITALS — BP 112/85 | HR 72 | Ht 64.0 in | Wt 216.8 lb

## 2016-02-10 DIAGNOSIS — E66812 Obesity, class 2: Secondary | ICD-10-CM

## 2016-02-10 DIAGNOSIS — Z6837 Body mass index (BMI) 37.0-37.9, adult: Secondary | ICD-10-CM

## 2016-02-10 DIAGNOSIS — E669 Obesity, unspecified: Secondary | ICD-10-CM

## 2016-02-10 MED ORDER — CYANOCOBALAMIN 1000 MCG/ML IJ SOLN: 1000.0000 ug | Freq: Once | INTRAMUSCULAR | Status: DC

## 2016-02-10 NOTE — Progress Notes (Signed)
Patient ID: Terri Saunders, female   DOB: 29-Mar-1970, 45 y.o.   MRN: [id number]  Pt presents for weight, B/P, B-12 injection. No side effects of medication-Phentermine, or B-12.  Weight loss of _5_ lbs. Encouraged eating healthy and exercise. Last recorded weight in August, 2017. Pt forgot B-12 medication and her mother (is a Engineer, civil (consulting)nurse) will give her injection.

## 2016-02-16 DIAGNOSIS — R339 Retention of urine, unspecified: Secondary | ICD-10-CM | POA: Insufficient documentation

## 2016-02-16 DIAGNOSIS — N3946 Mixed incontinence: Secondary | ICD-10-CM | POA: Insufficient documentation

## 2016-02-25 ENCOUNTER — Encounter: Payer: 59 | Admitting: Obstetrics and Gynecology

## 2016-03-09 ENCOUNTER — Ambulatory Visit: Payer: 59

## 2016-03-10 ENCOUNTER — Ambulatory Visit: Payer: 59

## 2016-03-11 ENCOUNTER — Ambulatory Visit (INDEPENDENT_AMBULATORY_CARE_PROVIDER_SITE_OTHER): Payer: 59 | Admitting: Obstetrics and Gynecology

## 2016-03-11 VITALS — BP 115/85 | HR 74 | Ht 64.0 in | Wt 220.0 lb

## 2016-03-11 DIAGNOSIS — Z6837 Body mass index (BMI) 37.0-37.9, adult: Secondary | ICD-10-CM

## 2016-03-11 DIAGNOSIS — E669 Obesity, unspecified: Secondary | ICD-10-CM

## 2016-03-11 MED ORDER — CYANOCOBALAMIN 1000 MCG/ML IJ SOLN
1000.0000 ug | Freq: Once | INTRAMUSCULAR | Status: AC
  Administered 2016-03-11: 1000 ug via INTRAMUSCULAR

## 2016-03-11 NOTE — Progress Notes (Signed)
Patient ID: Terri Saunders, female   DOB: 1971-02-25, 45 y.o.   MRN: [id number]  Pt presents for weight, B/P, B-12 injection. No side effects of medication-Phentermine, or B-12.  Weight gain of 4 lbs. Encouraged eating healthy and exercise. Pt states she has been stress eating due to work and the holidays. To get back on track with healthy eating and exercise.

## 2016-04-08 ENCOUNTER — Ambulatory Visit: Payer: 59

## 2016-04-26 ENCOUNTER — Other Ambulatory Visit: Payer: Self-pay | Admitting: Obstetrics and Gynecology

## 2016-04-26 DIAGNOSIS — Z1231 Encounter for screening mammogram for malignant neoplasm of breast: Secondary | ICD-10-CM

## 2016-05-05 ENCOUNTER — Encounter: Payer: Self-pay | Admitting: Obstetrics and Gynecology

## 2016-05-05 ENCOUNTER — Ambulatory Visit (INDEPENDENT_AMBULATORY_CARE_PROVIDER_SITE_OTHER): Payer: 59 | Admitting: Obstetrics and Gynecology

## 2016-05-05 ENCOUNTER — Other Ambulatory Visit: Payer: Self-pay | Admitting: Obstetrics and Gynecology

## 2016-05-05 VITALS — BP 118/80 | HR 98 | Ht 64.0 in | Wt 224.2 lb

## 2016-05-05 DIAGNOSIS — Z01419 Encounter for gynecological examination (general) (routine) without abnormal findings: Secondary | ICD-10-CM | POA: Diagnosis not present

## 2016-05-05 DIAGNOSIS — L661 Lichen planopilaris: Secondary | ICD-10-CM

## 2016-05-05 DIAGNOSIS — E669 Obesity, unspecified: Secondary | ICD-10-CM

## 2016-05-05 MED ORDER — CLOBETASOL PROPIONATE 0.05 % EX OINT: 1.0000 "application " | TOPICAL_OINTMENT | Freq: Two times a day (BID) | CUTANEOUS | 2 refills | Status: DC

## 2016-05-05 NOTE — Patient Instructions (Addendum)
 Preventive Care 18-39 Years, Female Preventive care refers to lifestyle choices and visits with your health care provider that can promote health and wellness. What does preventive care include?  A yearly physical exam. This is also called an annual well check.  Dental exams once or twice a year.  Routine eye exams. Ask your health care provider how often you should have your eyes checked.  Personal lifestyle choices, including:  Daily care of your teeth and gums.  Regular physical activity.  Eating a healthy diet.  Avoiding tobacco and drug use.  Limiting alcohol use.  Practicing safe sex.  Taking vitamin and mineral supplements as recommended by your health care provider. What happens during an annual well check? The services and screenings done by your health care provider during your annual well check will depend on your age, overall health, lifestyle risk factors, and family history of disease. Counseling  Your health care provider may ask you questions about your:  Alcohol use.  Tobacco use.  Drug use.  Emotional well-being.  Home and relationship well-being.  Sexual activity.  Eating habits.  Work and work environment.  Method of birth control.  Menstrual cycle.  Pregnancy history. Screening  You may have the following tests or measurements:  Height, weight, and BMI.  Diabetes screening. This is done by checking your blood sugar (glucose) after you have not eaten for a while (fasting).  Blood pressure.  Lipid and cholesterol levels. These may be checked every 5 years starting at age 20.  Skin check.  Hepatitis C blood test.  Hepatitis B blood test.  Sexually transmitted disease (STD) testing.  BRCA-related cancer screening. This may be done if you have a family history of breast, ovarian, tubal, or peritoneal cancers.  Pelvic exam and Pap test. This may be done every 3 years starting at age 21. Starting at age 30, this may be done  every 5 years if you have a Pap test in combination with an HPV test. Discuss your test results, treatment options, and if necessary, the need for more tests with your health care provider. Vaccines  Your health care provider may recommend certain vaccines, such as:  Influenza vaccine. This is recommended every year.  Tetanus, diphtheria, and acellular pertussis (Tdap, Td) vaccine. You may need a Td booster every 10 years.  Varicella vaccine. You may need this if you have not been vaccinated.  HPV vaccine. If you are 26 or younger, you may need three doses over 6 months.  Measles, mumps, and rubella (MMR) vaccine. You may need at least one dose of MMR. You may also need a second dose.  Pneumococcal 13-valent conjugate (PCV13) vaccine. You may need this if you have certain conditions and were not previously vaccinated.  Pneumococcal polysaccharide (PPSV23) vaccine. You may need one or two doses if you smoke cigarettes or if you have certain conditions.  Meningococcal vaccine. One dose is recommended if you are age 19-21 years and a first-year college student living in a residence hall, or if you have one of several medical conditions. You may also need additional booster doses.  Hepatitis A vaccine. You may need this if you have certain conditions or if you travel or work in places where you may be exposed to hepatitis A.  Hepatitis B vaccine. You may need this if you have certain conditions or if you travel or work in places where you may be exposed to hepatitis B.  Haemophilus influenzae type b (Hib) vaccine. You may need   this if you have certain risk factors. Talk to your health care provider about which screenings and vaccines you need and how often you need them. This information is not intended to replace advice given to you by your health care provider. Make sure you discuss any questions you have with your health care provider. Document Released: 05/03/2001 Document Revised:  11/25/2015 Document Reviewed: 01/06/2015 Elsevier Interactive Patient Education  2017 [employer] you for enrolling in [facility]. Please follow the instructions below to securely access your online medical record. MyChart allows you to send messages to your doctor, view your test results, renew your prescriptions, schedule appointments, and more.  How Do I Sign Up? 1. In your Internet browser, go to [email or web address]. 2. Click on the New  User? link in the Sign In box.  3. Enter your MyChart Access Code exactly as it appears below. You will not need to use this code after you have completed the sign-up process. If you do not sign up before the expiration date, you must request a new code. MyChart Access Code: [email or web address] Expires: 07/04/2016  3:41 PM  4. Enter the last four digits of your Social Security Number (xxxx) and Date of Birth (mm/dd/yyyy) as indicated and click Next. You will be taken to the next sign-up page. 5. Create a MyChart ID. This will be your MyChart login ID and cannot be changed, so think of one that is secure and easy to remember. 6. Create a MyChart password. You can change your password at any time. 7. Enter your Password Reset Question and Answer and click Next. This can be used at a later time if you forget your password.  8. Select your communication preference, and if applicable enter your e-mail address. You will receive e-mail notification when new information is available in MyChart by choosing to receive e-mail notifications and filling in your e-mail. 9. Click Sign In. You can now view your medical record.   Additional Information If you have questions, you can email [email or web address] or call [phone] to talk to our [facility] staff. Remember, MyChart is NOT to be used for urgent needs. For medical emergencies, dial 911.  Lichen Planus Introduction Lichen planus is a skin problem that causes redness, itching,  small bumps, and sores. It can affect the skin in any area of the body. Some common areas affected include:  Arms, wrists, legs, or ankles.  Chest, back, or abdomen.  Genital areas such as the vulva and vagina.  Gums and inside of the mouth.  Scalp.  Fingernails or toenails. Treatment can help control the symptoms of this condition. The condition can last for a long time. It can take 6-18 months for it to go away. What are the causes? The exact cause of this condition is not known. The condition is not passed from one person to another (not contagious). It may be related to an allergy or an autoimmune response. An autoimmune response occurs when the body's defense system (immune system) mistakenly attacks healthy tissues. What increases the risk? This condition is more likely to develop in:  People who are older than 45 years of age.  People who take certain medicines.  People who have been exposed to certain dyes or chemicals.  People with hepatitis C. What are the signs or symptoms? Symptoms of this condition may include:  Itching, which can be severe.  Small reddish or purple bumps on the skin. These may have flat tops and  may be round or irregular shaped.  Redness or white patches on the gums or tongue.  Redness, soreness, or a burning feeling in the genital area. This may lead to pain or bleeding during sex.  Changes in the fingernails or toenails. The nails may become thin or rough. They may have ridges in them.  Redness or irritation of the eyes. This is rare. How is this diagnosed? This condition may be diagnosed based on:  A physical exam. The health care provider will examine your affected skin and check for changes inside your mouth.  Removal of a tissue sample (biopsy sample) to be looked at under a microscope. How is this treated? Treatment for this condition may depend on the severity of symptoms. In some cases, no treatment is needed. If treatment is  needed to control symptoms, it may include:  Creams or ointments (topical steroids) to help control itching and irritation.  Medicine to be taken by mouth.  A treatment in which your skin is exposed to ultraviolet light (phototherapy).  Lozenges that you suck on to help treat sores in the mouth. Follow these instructions at home:  Take over-the-counter and prescription medicines only as told by your health care provider.  Use creams or ointments as told by your health care provider.  Do not scratch the affected areas of skin.  Women should keep the vaginal area as clean and dry as possible. Contact a health care provider if:  You have increasing redness, swelling, or pain in the affected area.  You have fluid, blood, or pus coming from the affected area.  Your eyes become irritated. This information is not intended to replace advice given to you by your health care provider. Make sure you discuss any questions you have with your health care provider. Document Released: 07/29/2010 Document Revised: 08/13/2015 Document Reviewed: 06/02/2014  2017 Elsevier

## 2016-05-05 NOTE — Progress Notes (Signed)
Subjective:   Terri Saunders is a 46 y.o. G1P0 Caucasian female here for a routine well-woman exam.  Patient's last menstrual period was 04/25/2016.    Current complaints: itching and persistant irritation in the vaginal area, tried corn starch with no relief. Goes away with menses.  PCP: [facility]       Does need labs  Social History: Sexual: heterosexual Marital Status: married Living situation: with family Occupation: unknown occupation Tobacco/alcohol: no tobacco use Illicit drugs: no history of illicit drug use  The following portions of the patient's history were reviewed and updated as appropriate: allergies, current medications, past family history, past medical history, past social history, past surgical history and problem list.  Past Medical History History reviewed. No pertinent past medical history.  Past Surgical History Past Surgical History:  Procedure Laterality Date  . DILATION AND CURETTAGE OF UTERUS  2003   following a miscarriage  . LAPAROSCOPY  2004   diagnostic for infertility work up    Gynecologic History G1P0  Patient's last menstrual period was 04/25/2016. Contraception: none Last Pap: 2014. Results were: normal Last mammogram: 2016. Results were: normal  Obstetric History OB History  Gravida Para Term Preterm AB Living  1            SAB TAB Ectopic Multiple Live Births               # Outcome Date GA Lbr Len/2nd Weight Sex Delivery Anes PTL Lv  1 Gravida 2006    M Vag-Spont         Current Medications Current Outpatient Prescriptions on File Prior to Visit  Medication Sig Dispense Refill  . cyanocobalamin (,VITAMIN B-12,) 1000 MCG/ML injection     . phentermine (ADIPEX-P) 37.5 MG tablet Take 1 tablet (37.5 mg total) by mouth daily before breakfast. (Patient not taking: Reported on 10/16/2015) 30 tablet 2   No current facility-administered medications on file prior to visit.     Review of Systems Patient denies any headaches, blurred  vision, shortness of breath, chest pain, abdominal pain, problems with bowel movements, urination, or intercourse.  Objective:  BP 118/80   Pulse 98   Ht 5\' 4"  (1.626 m)   Wt 224 lb 3.2 oz (101.7 kg)   LMP 04/25/2016   BMI 38.48 kg/m  Physical Exam  General:  Well developed, well nourished, no acute distress. She is alert and oriented x3. Skin:  Warm and dry Neck:  Midline trachea, no thyromegaly or nodules Cardiovascular: Regular rate and rhythm, no murmur heard Lungs:  Effort normal, all lung fields clear to auscultation bilaterally Breasts:  No dominant palpable mass, retraction, or nipple discharge Abdomen:  Soft, non tender, no hepatosplenomegaly or masses Pelvic:  External genitalia is normal in appearance.  The vagina is normal in appearance. The cervix is bulbous, no CMT.  Thin prep pap is done with HR HPV cotesting. Uterus is felt to be normal size, shape, and contour.  No adnexal masses or tenderness noted. 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

## 2016-05-07 LAB — LIPID PANEL W/O CHOL/HDL RATIO
Cholesterol, Total: 155 mg/dL (ref 100–199)
HDL: 43 mg/dL (ref >39)
LDL Calculated: 101 mg/dL — ABNORMAL HIGH (ref 0–99)
TRIGLYCERIDES: 54 mg/dL (ref 0–149)
VLDL CHOLESTEROL CAL: 11 mg/dL (ref 5–40)

## 2016-05-07 LAB — COMPREHENSIVE METABOLIC PANEL
A/G RATIO: 1.5 (ref 1.2–2.2)
ALT: 17 IU/L (ref 0–32)
AST: 17 IU/L (ref 0–40)
Albumin: 4.1 g/dL (ref 3.5–5.5)
Alkaline Phosphatase: 98 IU/L (ref 39–117)
BUN/Creatinine Ratio: 11 (ref 9–23)
BUN: 9 mg/dL (ref 6–24)
Bilirubin Total: 0.2 mg/dL (ref 0.0–1.2)
CALCIUM: 8.8 mg/dL (ref 8.7–10.2)
CO2: 25 mmol/L (ref 18–29)
Chloride: 99 mmol/L (ref 96–106)
Creatinine, Ser: 0.79 mg/dL (ref 0.57–1.00)
GFR calc Af Amer: 105 mL/min/{1.73_m2} (ref >59)
GFR, EST NON AFRICAN AMERICAN: 91 mL/min/{1.73_m2} (ref >59)
GLUCOSE: 89 mg/dL (ref 65–99)
Globulin, Total: 2.7 g/dL (ref 1.5–4.5)
Potassium: 3.8 mmol/L (ref 3.5–5.2)
Sodium: 139 mmol/L (ref 134–144)
TOTAL PROTEIN: 6.8 g/dL (ref 6.0–8.5)

## 2016-05-07 LAB — THYROID PANEL WITH TSH
Free Thyroxine Index: 2 (ref 1.2–4.9)
T3 Uptake Ratio: 25 % (ref 24–39)
T4 TOTAL: 7.9 ug/dL (ref 4.5–12.0)
TSH: 1.36 u[IU]/mL (ref 0.450–4.500)

## 2016-05-07 LAB — HGB A1C W/O EAG: Hgb A1c MFr Bld: 5.1 % (ref 4.8–5.6)

## 2016-05-07 LAB — VITAMIN D 25 HYDROXY (VIT D DEFICIENCY, FRACTURES): VIT D 25 HYDROXY: 25.1 ng/mL — AB (ref 30.0–100.0)

## 2016-05-10 LAB — CYTOLOGY - PAP

## 2016-05-13 ENCOUNTER — Ambulatory Visit
Admission: RE | Admit: 2016-05-13 | Discharge: 2016-05-13 | Disposition: A | Discharge status: Home / Self Care | Payer: 59 | Source: Ambulatory Visit | Attending: Obstetrics and Gynecology | Admitting: Obstetrics and Gynecology

## 2016-05-13 DIAGNOSIS — Z1231 Encounter for screening mammogram for malignant neoplasm of breast: Secondary | ICD-10-CM | POA: Insufficient documentation

## 2016-05-18 ENCOUNTER — Telehealth: Payer: Self-pay | Admitting: *Deleted

## 2016-05-18 ENCOUNTER — Other Ambulatory Visit: Payer: Self-pay | Admitting: Obstetrics and Gynecology

## 2016-05-18 DIAGNOSIS — E559 Vitamin D deficiency, unspecified: Secondary | ICD-10-CM

## 2016-05-18 MED ORDER — VITAMIN D (ERGOCALCIFEROL) 1.25 MG (50000 UNIT) PO CAPS: 50000.0000 [IU] | ORAL_CAPSULE | ORAL | 1 refills | Status: DC

## 2016-05-18 NOTE — Telephone Encounter (Signed)
Mailed pt all info about labs 

## 2016-05-18 NOTE — Telephone Encounter (Signed)
-----   Message from Purcell NailsMelody N Shambley, [place] sent at 05/18/2016 10:53 AM EST ----- Please let her know all labs are good except vit D- please mail info and I sent in RX for weekly supplement, and want to recheck lab in 3 months.

## 2016-11-09 LAB — LIPID PANEL
Cholesterol: 141 (ref 0–200)
HDL: 45 (ref 35–70)
LDL CALC: 84
Triglycerides: 61 (ref 40–160)

## 2016-11-09 LAB — BASIC METABOLIC PANEL
Creatinine: 0.8 (ref 0.5–1.1)
Glucose: 87

## 2016-11-09 LAB — HEMOGLOBIN A1C: Hemoglobin A1C: 5.3

## 2017-01-10 ENCOUNTER — Ambulatory Visit
Admission: RE | Admit: 2017-01-10 | Discharge: 2017-01-10 | Disposition: A | Discharge status: Home / Self Care | Payer: 59 | Source: Ambulatory Visit | Attending: Family Medicine | Admitting: Family Medicine

## 2017-01-10 ENCOUNTER — Ambulatory Visit (INDEPENDENT_AMBULATORY_CARE_PROVIDER_SITE_OTHER): Payer: 59 | Admitting: Family Medicine

## 2017-01-10 ENCOUNTER — Encounter: Payer: Self-pay | Admitting: Family Medicine

## 2017-01-10 VITALS — BP 122/86 | HR 76 | Temp 98.6°F | Ht 64.0 in | Wt 226.6 lb

## 2017-01-10 DIAGNOSIS — G8929 Other chronic pain: Secondary | ICD-10-CM | POA: Diagnosis not present

## 2017-01-10 DIAGNOSIS — Z23 Encounter for immunization: Secondary | ICD-10-CM

## 2017-01-10 DIAGNOSIS — M545 Low back pain, unspecified: Secondary | ICD-10-CM

## 2017-01-10 DIAGNOSIS — M533 Sacrococcygeal disorders, not elsewhere classified: Secondary | ICD-10-CM | POA: Diagnosis not present

## 2017-01-10 DIAGNOSIS — Z6838 Body mass index (BMI) 38.0-38.9, adult: Secondary | ICD-10-CM

## 2017-01-10 DIAGNOSIS — Z0000 Encounter for general adult medical examination without abnormal findings: Secondary | ICD-10-CM | POA: Diagnosis not present

## 2017-01-10 DIAGNOSIS — E66812 Drug-induced obesity: Secondary | ICD-10-CM

## 2017-01-10 DIAGNOSIS — M4807 Spinal stenosis, lumbosacral region: Secondary | ICD-10-CM | POA: Diagnosis not present

## 2017-01-10 DIAGNOSIS — E661 Drug-induced obesity: Secondary | ICD-10-CM | POA: Diagnosis not present

## 2017-01-10 NOTE — Patient Instructions (Signed)
Obesity, Adult Obesity is the condition of having too much total body fat. Being overweight or obese means that your weight is greater than what is considered healthy for your body size. Obesity is determined by a measurement called BMI. BMI is an estimate of body fat and is calculated from height and weight. For adults, a BMI of 30 or higher is considered obese. Obesity can eventually lead to other health concerns and major illnesses, including:  Stroke.  Coronary artery disease (CAD).  Type 2 diabetes.  Some types of cancer, including cancers of the colon, breast, uterus, and gallbladder. Osteoarthritis. Calorie Counting for Weight Loss Calories are units of energy. Your body needs a certain amount of calories from food to keep you going throughout the day. When you eat more calories than your body needs, your body stores the extra calories as fat. When you eat fewer calories than your body needs, your body burns fat to get the energy it needs. Calorie counting means keeping track of how many calories you eat and drink each day. Calorie counting can be helpful if you need to lose weight. If you make sure to eat fewer calories than your body needs, you should lose weight. Ask your health care provider what a healthy weight is for you. For calorie counting to work, you will need to eat the right number of calories in a day in order to lose a healthy amount of weight per week. A dietitian can help you determine how many calories you need in a day and will give you suggestions on how to reach your calorie goal.  A healthy amount of weight to lose per week is usually 1-2 lb (0.5-0.9 kg). This usually means that your daily calorie intake should be reduced by 500-750 calories.  Eating 1,200 - 1,500 calories per day can help most women lose weight.  Eating 1,500 - 1,800 calories per day can help most men lose weight.  What is my plan? My goal is to have __1200-1400________ calories per day. If I  have this many calories per day, I should lose around _____1-2_____ pounds per week. What do I need to know about calorie counting? In order to meet your daily calorie goal, you will need to:  Find out how many calories are in each food you would like to eat. Try to do this before you eat.  Decide how much of the food you plan to eat.  Write down what you ate and how many calories it had. Doing this is called keeping a food log.  To successfully lose weight, it is important to balance calorie counting with a healthy lifestyle that includes regular activity. Aim for 150 minutes of moderate exercise (such as walking) or 75 minutes of vigorous exercise (such as running) each week. Where do I find calorie information?  The number of calories in a food can be found on a Nutrition Facts label. If a food does not have a Nutrition Facts label, try to look up the calories online or ask your dietitian for help. Remember that calories are listed per serving. If you choose to have more than one serving of a food, you will have to multiply the calories per serving by the amount of servings you plan to eat. For example, the label on a package of bread might say that a serving size is 1 slice and that there are 90 calories in a serving. If you eat 1 slice, you will have eaten 90 calories. If  you eat 2 slices, you will have eaten 180 calories. How do I keep a food log? Immediately after each meal, record the following information in your food log:  What you ate. Don't forget to include toppings, sauces, and other extras on the food.  How much you ate. This can be measured in cups, ounces, or number of items.  How many calories each food and drink had.  The total number of calories in the meal.  Keep your food log near you, such as in a small notebook in your pocket, or use a mobile app or website. Some programs will calculate calories for you and show you how many calories you have left for the day to meet  your goal. What are some calorie counting tips?  Use your calories on foods and drinks that will fill you up and not leave you hungry: ? Some examples of foods that fill you up are nuts and nut butters, vegetables, lean proteins, and high-fiber foods like whole grains. High-fiber foods are foods with more than 5 g fiber per serving. ? Drinks such as sodas, specialty coffee drinks, alcohol, and juices have a lot of calories, yet do not fill you up.  Eat nutritious foods and avoid empty calories. Empty calories are calories you get from foods or beverages that do not have many vitamins or protein, such as candy, sweets, and soda. It is better to have a nutritious high-calorie food (such as an avocado) than a food with few nutrients (such as a bag of chips).  Know how many calories are in the foods you eat most often. This will help you calculate calorie counts faster.  Pay attention to calories in drinks. Low-calorie drinks include water and unsweetened drinks.  Pay attention to nutrition labels for "low fat" or "fat free" foods. These foods sometimes have the same amount of calories or more calories than the full fat versions. They also often have added sugar, starch, or salt, to make up for flavor that was removed with the fat.  Find a way of tracking calories that works for you. Get creative. Try different apps or programs if writing down calories does not work for you. What are some portion control tips?  Know how many calories are in a serving. This will help you know how many servings of a certain food you can have.  Use a measuring cup to measure serving sizes. You could also try weighing out portions on a kitchen scale. With time, you will be able to estimate serving sizes for some foods.  Take some time to put servings of different foods on your favorite plates, bowls, and cups so you know what a serving looks like.  Try not to eat straight from a bag or box. Doing this can lead to  overeating. Put the amount you would like to eat in a cup or on a plate to make sure you are eating the right portion.  Use smaller plates, glasses, and bowls to prevent overeating.  Try not to multitask (for example, watch TV or use your computer) while eating. If it is time to eat, sit down at a table and enjoy your food. This will help you to know when you are full. It will also help you to be aware of what you are eating and how much you are eating. What are tips for following this plan? Reading food labels  Check the calorie count compared to the serving size. The serving size may be  smaller than what you are used to eating.  Check the source of the calories. Make sure the food you are eating is high in vitamins and protein and low in saturated and trans fats. Shopping  Read nutrition labels while you shop. This will help you make healthy decisions before you decide to purchase your food.  Make a grocery list and stick to it. Cooking  Try to cook your favorite foods in a healthier way. For example, try baking instead of frying.  Use low-fat dairy products. Meal planning  Use more fruits and vegetables. Half of your plate should be fruits and vegetables.  Include lean proteins like poultry and fish. How do I count calories when eating out?  Ask for smaller portion sizes.  Consider sharing an entree and sides instead of getting your own entree.  If you get your own entree, eat only half. Ask for a box at the beginning of your meal and put the rest of your entree in it so you are not tempted to eat it.  If calories are listed on the menu, choose the lower calorie options.  Choose dishes that include vegetables, fruits, whole grains, low-fat dairy products, and lean protein.  Choose items that are boiled, broiled, grilled, or steamed. Stay away from items that are buttered, battered, fried, or served with cream sauce. Items labeled "crispy" are usually fried, unless stated  otherwise.  Choose water, low-fat milk, unsweetened iced tea, or other drinks without added sugar. If you want an alcoholic beverage, choose a lower calorie option such as a glass of wine or light beer.  Ask for dressings, sauces, and syrups on the side. These are usually high in calories, so you should limit the amount you eat.  If you want a salad, choose a garden salad and ask for grilled meats. Avoid extra toppings like bacon, cheese, or fried items. Ask for the dressing on the side, or ask for olive oil and vinegar or lemon to use as dressing.  Estimate how many servings of a food you are given. For example, a serving of cooked rice is  cup or about the size of half a baseball. Knowing serving sizes will help you be aware of how much food you are eating at restaurants. The list below tells you how big or small some common portion sizes are based on everyday objects: ? 1 oz-4 stacked dice. ? 3 oz-1 deck of cards. ? 1 tsp-1 die. ? 1 Tbsp- a ping-pong ball. ? 2 Tbsp-1 ping-pong ball. ?  cup- baseball. ? 1 cup-1 baseball. Summary  Calorie counting means keeping track of how many calories you eat and drink each day. If you eat fewer calories than your body needs, you should lose weight.  A healthy amount of weight to lose per week is usually 1-2 lb (0.5-0.9 kg). This usually means reducing your daily calorie intake by 500-750 calories.  The number of calories in a food can be found on a Nutrition Facts label. If a food does not have a Nutrition Facts label, try to look up the calories online or ask your dietitian for help.  Use your calories on foods and drinks that will fill you up, and not on foods and drinks that will leave you hungry.  Use smaller plates, glasses, and bowls to prevent overeating. This information is not intended to replace advice given to you by your health care provider. Make sure you discuss any questions you have with your health care provider.  Document  Released: 03/07/2005 Document Revised: 02/05/2016 Document Reviewed: 02/05/2016 Elsevier Interactive Patient Education  2017 [employer]    High blood pressure (hypertension).  High cholesterol.  Sleep apnea.  Gallbladder stones.  Infertility problems.  What are the causes? The main cause of obesity is taking in (consuming) more calories than your body uses for energy. Other factors that contribute to this condition may include:  Being born with genes that make you more likely to become obese.  Having a medical condition that causes obesity. These conditions include: ? Hypothyroidism. ? Polycystic ovarian syndrome (PCOS). ? Binge-eating disorder. ? Cushing syndrome.  Taking certain medicines, such as steroids, antidepressants, and seizure medicines.  Not being physically active (sedentary lifestyle).  Living where there are limited places to exercise safely or buy healthy foods.  Not getting enough sleep.  What increases the risk? The following factors may increase your risk of this condition:  Having a family history of obesity.  Being a woman of African-American descent.  Being a man of Hispanic descent.  What are the signs or symptoms? Having excessive body fat is the main symptom of this condition. How is this diagnosed? This condition may be diagnosed based on:  Your symptoms.  Your medical history.  A physical exam. Your health care provider may measure: ? Your BMI. If you are an adult with a BMI between 25 and less than 30, you are considered overweight. If you are an adult with a BMI of 30 or higher, you are considered obese. ? The distances around your hips and your waist (circumferences). These may be compared to each other to help diagnose your condition. ? Your skinfold thickness. Your health care provider may gently pinch a fold of your skin and measure it.  How is this treated? Treatment for this condition often includes changing your  lifestyle. Treatment may include some or all of the following:  Dietary changes. Work with your health care provider and a dietitian to set a weight-loss goal that is healthy and reasonable for you. Dietary changes may include eating: ? Smaller portions. A portion size is the amount of a particular food that is healthy for you to eat at one time. This varies from person to person. ? Low-calorie or low-fat options. ? More whole grains, fruits, and vegetables.  Regular physical activity. This may include aerobic activity (cardio) and strength training.  Medicine to help you lose weight. Your health care provider may prescribe medicine if you are unable to lose 1 pound a week after 6 weeks of eating more healthily and doing more physical activity.  Surgery. Surgical options may include gastric banding and gastric bypass. Surgery may be done if: ? Other treatments have not helped to improve your condition. ? You have a BMI of 40 or higher. ? You have life-threatening health problems related to obesity.  Follow these instructions at home:  Eating and drinking   Follow recommendations from your health care provider about what you eat and drink. Your health care provider may advise you to: ? Limit fast foods, sweets, and processed snack foods. ? Choose low-fat options, such as low-fat milk instead of whole milk. ? Eat 5 or more servings of fruits or vegetables every day. ? Eat at home more often. This gives you more control over what you eat. ? Choose healthy foods when you eat out. ? Learn what a healthy portion size is. ? Keep low-fat snacks on hand. ? Avoid sugary drinks, such as soda, fruit  juice, iced tea sweetened with sugar, and flavored milk. ? Eat a healthy breakfast.  Drink enough water to keep your urine clear or pale yellow.  Do not go without eating for long periods of time (do not fast) or follow a fad diet. Fasting and fad diets can be unhealthy and even dangerous. Physical  Activity  Exercise regularly, as told by your health care provider. Ask your health care provider what types of exercise are safe for you and how often you should exercise.  Warm up and stretch before being active.  Cool down and stretch after being active.  Rest between periods of activity. Lifestyle  Limit the time that you spend in front of your TV, computer, or video game system.  Find ways to reward yourself that do not involve food.  Limit alcohol intake to no more than 1 drink a day for nonpregnant women and 2 drinks a day for men. One drink equals 12 oz of beer, 5 oz of wine, or 1 oz of hard liquor. General instructions  Keep a weight loss journal to keep track of the food you eat and how much you exercise you get.  Take over-the-counter and prescription medicines only as told by your health care provider.  Take vitamins and supplements only as told by your health care provider.  Consider joining a support group. Your health care provider may be able to recommend a support group.  Keep all follow-up visits as told by your health care provider. This is important. Contact a health care provider if:  You are unable to meet your weight loss goal after 6 weeks of dietary and lifestyle changes. This information is not intended to replace advice given to you by your health care provider. Make sure you discuss any questions you have with your health care provider. Document Released: 04/14/2004 Document Revised: 08/10/2015 Document Reviewed: 12/24/2014 Elsevier Interactive Patient Education  2017 [employer].

## 2017-01-10 NOTE — Progress Notes (Signed)
Patient: Terri Saunders, Female    DOB: November 18, 1970, 46 y.o.   MRN: [id number] Visit Date: 01/10/2017  Today's Provider: Dortha Kern, PA   Chief Complaint  Patient presents with  . Annual Exam   Subjective:    Annual physical exam Terri Saunders is a 46 y.o. female who presents today for health maintenance and complete physical. She feels fairly well. She reports exercising none. She reports she is sleeping poorly.  -----------------------------------------------------------------   Review of Systems  Constitutional: Negative.   HENT: Positive for postnasal drip.   Eyes: Negative.   Respiratory: Negative.   Cardiovascular: Negative.   Gastrointestinal: Positive for blood in stool, constipation and diarrhea.  Endocrine: Negative.   Genitourinary: Positive for enuresis and genital sores.  Musculoskeletal: Positive for arthralgias, back pain, neck pain and neck stiffness.  Skin: Negative.   Allergic/Immunologic: Positive for environmental allergies.  Neurological: Positive for headaches.  Hematological: Negative.   Psychiatric/Behavioral: Positive for sleep disturbance. The patient is nervous/anxious.     Social History      She  reports that she has quit smoking. She has never used smokeless tobacco. She reports that she does not drink alcohol or use drugs.       Social History   Social History  . Marital status: Married    Spouse name: N/A  . Number of children: N/A  . Years of education: N/A   Social History Main Topics  . Smoking status: Former Games developer  . Smokeless tobacco: Never Used  . Alcohol use No  . Drug use: No  . Sexual activity: Yes    Birth control/ protection: None   Other Topics Concern  . None   Social History Narrative  . None    No past medical history on file.   Patient Active Problem List   Diagnosis Date Noted  . Vitamin D deficiency 05/18/2016  . 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 Incomplete emptying of  bladder 02/16/2016  . Lumbar herniated disc 11/05/2014  . Abdominal pain, right lower quadrant 08/11/2014  . Abnormal glucose tolerance test 08/11/2014  . Pain in soft tissues of limb 08/11/2014  . Cervical pain 09/12/2006  . Generalized muscle weakness 09/12/2006  . Clinical depression 05/13/2005  . Malaise and fatigue 05/13/2005    Past Surgical History:  Procedure Laterality Date  . DILATION AND CURETTAGE OF UTERUS  2003   following a miscarriage  . LAPAROSCOPY  2004   diagnostic for infertility work up    Family History        Family Status  Relation Status  . Mother Alive  . Father Alive  . Brother Alive  . MGM Deceased  . MGF Deceased  . PGM Alive  . PGF Deceased        Her family history includes Colon cancer in her maternal grandfather and maternal grandmother; Coronary artery disease in her paternal grandmother; Heart attack in her paternal grandfather; Heart disease in her maternal grandfather; Hyperlipidemia in her mother; Hypertension in her paternal grandmother; Irritable bowel syndrome in her brother.     Allergies  Allergen Reactions  . Sulfa Antibiotics Hives     Current Outpatient Prescriptions:  .  clobetasol ointment (TEMOVATE) 0.05 %, Apply 1 application topically 2 (two) times daily. Apply to affected area, Disp: 30 g, Rfl: 2   Patient Care Team: Abdalla Naramore, Jodell Cipro, PA as PCP - General (Physician Assistant)      Objective:   Vitals: BP 122/86 (  BP Location: Right Arm, Patient Position: Sitting, Cuff Size: Normal)   Pulse 76   Temp 98.6 F (37 C) (Oral)   Ht 5\' 4"  (1.626 m)   Wt 226 lb 9.6 oz (102.8 kg)   SpO2 98%   BMI 38.90 kg/m    Wt Readings from Last 3 Encounters:  01/10/17 226 lb 9.6 oz (102.8 kg)  05/05/16 224 lb 3.2 oz (101.7 kg)  03/11/16 220 lb (99.8 kg)    Vitals:   01/10/17 1421  BP: 122/86  Pulse: 76  Temp: 98.6 F (37 C)  TempSrc: Oral  SpO2: 98%  Weight: 226 lb 9.6 oz (102.8 kg)  Height: 5\' 4"  (1.626 m)      Physical Exam  Constitutional: She is oriented to person, place, and time. She appears well-developed and well-nourished.  HENT:  Head: Normocephalic and atraumatic.  Right Ear: External ear normal.  Left Ear: External ear normal.  Nose: Nose normal.  Mouth/Throat: Oropharynx is clear and moist.  Eyes: Pupils are equal, round, and reactive to light. Conjunctivae and EOM are normal. Right eye exhibits no discharge.  Neck: Normal range of motion. Neck supple. No tracheal deviation present. No thyromegaly present.  Cardiovascular: Normal rate, regular rhythm, normal heart sounds and intact distal pulses.   No murmur heard. Pulmonary/Chest: Effort normal and breath sounds normal. No respiratory distress. She has no wheezes. She has no rales. She exhibits no tenderness.  Abdominal: Soft. She exhibits no distension and no mass. There is no tenderness. There is no rebound and no guarding.  Genitourinary:  Genitourinary Comments: Deferred to GYN.  Musculoskeletal: Normal range of motion. She exhibits tenderness. She exhibits no edema.  Tender over the right SI joint. Increased discomfort with bending over to touch toes.   Lymphadenopathy:    She has no cervical adenopathy.  Neurological: She is alert and oriented to person, place, and time. She has normal reflexes. No cranial nerve deficit. She exhibits normal muscle tone. Coordination normal.  Skin: Skin is warm and dry. No rash noted. No erythema.  Psychiatric: She has a normal mood and affect. Her behavior is normal. Judgment and thought content normal.     Depression Screen PHQ 2/9 Scores 01/10/2017  PHQ - 2 Score 3  PHQ- 9 Score 12      Assessment & Plan:     Routine Health Maintenance and Physical Exam  Exercise Activities and Dietary recommendations Goals    Recommend 30 minute exercise 4 days a week at home.      Health Maintenance  Topic Date Due  . HIV Screening  10/19/1986  . TETANUS/TDAP  10/19/1990  .  INFLUENZA VACCINE  06/18/2017 (Originally 10/19/2016)  . PAP SMEAR  05/06/2019     Discussed health benefits of physical activity, and encouraged her to engage in regular exercise appropriate for her age and condition.    -------------------------------------------------------------------- 1. Annual physical exam General health stable. Immunizations up to date. Refuses flu shot and had some screening labs at work. Will send copy of reports. See recommendations above.  2. Chronic right-sided low back pain without sciatica Chronic intermittent pain in right SI joint region. Ibuprofen 600-800 mg TID prn and moist heat applications help. Will check x-ray to rule out arthritic joint versus other pathology. Gets mammograms, PAP and GYN exam at [facility] annually. - DG Lumbar Spine Complete  3. 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 Given 1200-1400 calorie diet and encouraged 30 minute exercise program 4 days a week. Recheck progress in 3 months.     Dortha Kern, PA  [facility]

## 2017-01-11 ENCOUNTER — Encounter: Payer: Self-pay | Admitting: Family Medicine

## 2017-04-26 ENCOUNTER — Ambulatory Visit (INDEPENDENT_AMBULATORY_CARE_PROVIDER_SITE_OTHER): Payer: Managed Care, Other (non HMO) | Admitting: Certified Nurse Midwife

## 2017-04-26 ENCOUNTER — Encounter: Payer: Self-pay | Admitting: Certified Nurse Midwife

## 2017-04-26 VITALS — BP 121/88 | HR 63 | Ht 64.0 in | Wt 225.0 lb

## 2017-04-26 DIAGNOSIS — N898 Other specified noninflammatory disorders of vagina: Secondary | ICD-10-CM | POA: Diagnosis not present

## 2017-04-26 DIAGNOSIS — L900 Lichen sclerosus et atrophicus: Secondary | ICD-10-CM | POA: Diagnosis not present

## 2017-04-26 MED ORDER — FLUCONAZOLE 150 MG PO TABS: 150.0000 mg | ORAL_TABLET | Freq: Once | ORAL | 0 refills | Status: AC

## 2017-04-26 NOTE — Progress Notes (Signed)
Pt is here with c/o vaginal itching and irritation.

## 2017-04-26 NOTE — Patient Instructions (Signed)

## 2017-04-26 NOTE — Progress Notes (Signed)
GYN ENCOUNTER NOTE  Subjective:       Terri Saunders is a 47 y.o. G1P0 female is here for gynecologic evaluation of the following issues:  1. ncreased vaginal itching and pain for the past 3-4 months. Pt has history of lichen sclerosis and is on Clobetasol.  She state the pain is a 6/10. The clobetasol was working at first but it seems it has not been as effective lately. She is unsure if she is having increased vaginal discharge. She denies odor and urinary symptoms.    Gynecologic History No LMP recorded. Contraception: none Last Pap: 05/05/16. Results were: normal, HPV neg Last mammogram: 05/13/16. Results were: normal  Obstetric History OB History  Gravida Para Term Preterm AB Living  1            SAB TAB Ectopic Multiple Live Births               # Outcome Date GA Lbr Len/2nd Weight Sex Delivery Anes PTL Lv  1 Gravida 2006    M Vag-Spont         No past medical history on file.  Past Surgical History:  Procedure Laterality Date  . DILATION AND CURETTAGE OF UTERUS  2003   following a miscarriage  . LAPAROSCOPY  2004   diagnostic for infertility work up    Current Outpatient Medications on File Prior to Visit  Medication Sig Dispense Refill  . clobetasol ointment (TEMOVATE) 0.05 % Apply 1 application topically 2 (two) times daily. Apply to affected area 30 g 2   No current facility-administered medications on file prior to visit.     Allergies  Allergen Reactions  . Sulfa Antibiotics Hives    Social History   Socioeconomic History  . Marital status: Married    Spouse name: Not on file  . Number of children: Not on file  . Years of education: Not on file  . Highest education level: Not on file  Social Needs  . Financial resource strain: Not on file  . Food insecurity - worry: Not on file  . Food insecurity - inability: Not on file  . Transportation needs - medical: Not on file  . Transportation needs - non-medical: Not on file  Occupational History  . Not  on file  Tobacco Use  . Smoking status: Former Games developermoker  . Smokeless tobacco: Never Used  Substance and Sexual Activity  . Alcohol use: No    Alcohol/week: 0.0 oz  . Drug use: No  . Sexual activity: Yes    Birth control/protection: None  Other Topics Concern  . Not on file  Social History Narrative  . Not on file    Family History  Problem Relation Age of Onset  . Hyperlipidemia Mother   . Irritable bowel syndrome Brother   . Colon cancer Maternal Grandmother   . Heart disease Maternal Grandfather   . Colon cancer Maternal Grandfather   . Coronary artery disease Paternal Grandmother   . Hypertension Paternal Grandmother   . Heart attack Paternal Grandfather     The following portions of the patient's history were reviewed and updated as appropriate: allergies, current medications, past family history, past medical history, past social history, past surgical history and problem list.  Review of Systems Review of Systems - Negative except as mentioned in HPI Review of Systems - General ROS: negative for - chills, fatigue, fever, hot flashes, malaise or night sweats Hematological and Lymphatic ROS: negative for - bleeding problems or swollen  lymph nodes Gastrointestinal ROS: negative for - abdominal pain, blood in stools, change in bowel habits and nausea/vomiting Musculoskeletal ROS: negative for - joint pain, muscle pain or muscular weakness Genito-Urinary ROS: negative for - change in menstrual cycle, dysmenorrhea, dyspareunia, dysuria, genital discharge, genital ulcers, hematuria, incontinence, irregular/heavy menses, nocturia or pelvic pain. Posoitve for vaginal itching external and internal and pain  Objective:   There were no vitals taken for this visit. CONSTITUTIONAL: Well-developed, well-nourished female in no acute distress.  HENT:  Normocephalic, atraumatic.  NECK: Normal range of motion, supple,   SKIN: Skin is warm and dry. No rash noted. Not diaphoretic. No  erythema. No pallor. NEUROLGIC: Alert and oriented to person, place, and time.  PSYCHIATRIC: Normal mood and affect. Normal behavior. Normal judgment and thought content. CARDIOVASCULAR:Not Examined RESPIRATORY: Not Examined BREASTS: Not Examined ABDOMEN: Soft, non distended; Non tender.  No Organomegaly. PELVIC:  External Genitalia: Normal, erythema noted on labia  BUS: Normal  Vagina: Normal, white particulate discharge noted with no odor   Cervix: Normal  mal range of motion. No tenderness.  No cyanosis, clubbing, or edema.   Assessment:   Lichen Sclerosis Vaginitis     Plan:   Nuswab for yeast/BV, pt given sample of monistat to use externally and prescription for diflucan. She is to with hold for use of steroid for 2 days while taking the diflucan and monistat. Then she may resume. Will follow up with results of Nuswab. Follow up PRN.   Doreene Burke, CNM

## 2017-04-30 LAB — NUSWAB BV AND CANDIDA, NAA
CANDIDA GLABRATA, NAA: NEGATIVE
Candida albicans, NAA: POSITIVE — AB

## 2017-05-01 ENCOUNTER — Telehealth: Payer: Self-pay

## 2017-05-01 NOTE — Telephone Encounter (Signed)
Pt informed culture positive for yeast and was treated and states she is doing much better.

## 2017-05-19 ENCOUNTER — Encounter: Payer: 59 | Admitting: Obstetrics and Gynecology

## 2017-05-26 ENCOUNTER — Ambulatory Visit (INDEPENDENT_AMBULATORY_CARE_PROVIDER_SITE_OTHER): Payer: Managed Care, Other (non HMO) | Admitting: Certified Nurse Midwife

## 2017-05-26 ENCOUNTER — Encounter: Payer: Self-pay | Admitting: Certified Nurse Midwife

## 2017-05-26 VITALS — BP 120/85 | HR 71 | Ht 64.0 in | Wt 230.2 lb

## 2017-05-26 DIAGNOSIS — N898 Other specified noninflammatory disorders of vagina: Secondary | ICD-10-CM | POA: Diagnosis not present

## 2017-05-26 MED ORDER — FLUCONAZOLE 150 MG PO TABS: 150.0000 mg | ORAL_TABLET | Freq: Once | ORAL | 0 refills | Status: AC

## 2017-05-26 NOTE — Patient Instructions (Signed)

## 2017-05-26 NOTE — Progress Notes (Signed)
GYN ENCOUNTER NOTE  Subjective:       Terri Saunders is a 47 y.o. G1P0 female is here for gynecologic evaluation of the following issues:  1. Vaginal swelling and pain. She states this started on Wednesday and has worsened. She denies recent intercourse or any new partners. Her history is significant for Lichen sclerosus. She has not used her clobetasol for fear of another yeast infection. She denies itching and admits to a white discharge. She denies any recent use of antibiotics. Her period was approximately 2 wks ago.    Gynecologic History No LMP recorded. Last Pap:05/05/16. Results were: normal Last mammogram: 05/16/16. Results were: normal  Obstetric History OB History  Gravida Para Term Preterm AB Living  1            SAB TAB Ectopic Multiple Live Births               # Outcome Date GA Lbr Len/2nd Weight Sex Delivery Anes PTL Lv  1 Gravida 2006    M Vag-Spont         No past medical history on file.  Past Surgical History:  Procedure Laterality Date  . DILATION AND CURETTAGE OF UTERUS  2003   following a miscarriage  . LAPAROSCOPY  2004   diagnostic for infertility work up    Current Outpatient Medications on File Prior to Visit  Medication Sig Dispense Refill  . clobetasol ointment (TEMOVATE) 0.05 % Apply 1 application topically 2 (two) times daily. Apply to affected area 30 g 2   No current facility-administered medications on file prior to visit.     Allergies  Allergen Reactions  . Sulfa Antibiotics Hives    Social History   Socioeconomic History  . Marital status: Married    Spouse name: Not on file  . Number of children: Not on file  . Years of education: Not on file  . Highest education level: Not on file  Social Needs  . Financial resource strain: Not on file  . Food insecurity - worry: Not on file  . Food insecurity - inability: Not on file  . Transportation needs - medical: Not on file  . Transportation needs - non-medical: Not on file   Occupational History  . Not on file  Tobacco Use  . Smoking status: Former Games developermoker  . Smokeless tobacco: Never Used  Substance and Sexual Activity  . Alcohol use: No    Alcohol/week: 0.0 oz  . Drug use: No  . Sexual activity: Yes    Birth control/protection: None  Other Topics Concern  . Not on file  Social History Narrative  . Not on file    Family History  Problem Relation Age of Onset  . Hyperlipidemia Mother   . Irritable bowel syndrome Brother   . Colon cancer Maternal Grandmother   . Heart disease Maternal Grandfather   . Colon cancer Maternal Grandfather   . Coronary artery disease Paternal Grandmother   . Hypertension Paternal Grandmother   . Heart attack Paternal Grandfather     The following portions of the patient's history were reviewed and updated as appropriate: allergies, current medications, past family history, past medical history, past social history, past surgical history and problem list.  Review of Systems Review of Systems - Negative except as mentioned in HPI Review of Systems - General ROS: negative for - chills, fatigue, fever, hot flashes, malaise or night sweats Hematological and Lymphatic ROS: negative for - bleeding problems or swollen lymph nodes  Gastrointestinal ROS: negative for - abdominal pain, blood in stools, change in bowel habits and nausea/vomiting Musculoskeletal ROS: negative for - joint pain, muscle pain or muscular weakness Genito-Urinary ROS: negative for - change in menstrual cycle, dysmenorrhea, dyspareunia, dysuria, genital discharge, genital ulcers, hematuria, incontinence, irregular/heavy menses, nocturia or pelvic pain. Positive for vaginal swelling and pain.   Objective:   There were no vitals taken for this visit. CONSTITUTIONAL: Well-developed, well-nourished female in no acute distress.  HENT:  Normocephalic, atraumatic.  NECK: Normal range of motion, supple, no masses.  Normal thyroid.  SKIN: Skin is warm and dry.  No rash noted. Not diaphoretic. No erythema. No pallor. NEUROLGIC: Alert and oriented to person, place, and time. PSYCHIATRIC: Normal mood and affect. Normal behavior. Normal judgment and thought content. CARDIOVASCULAR:Not Examined RESPIRATORY: Not Examined BREASTS: Not Examined ABDOMEN: Soft, non distended; Non tender.  No Organomegaly. PELVIC:  External Genitalia: Normal  BUS: Normal  Vagina: Normal, labia red with white flaky discharge,   Cervix: Normal, white to clear discharge noted, no odor MUSCULOSKELETAL: Normal range of motion. No tenderness.  No cyanosis, clubbing, or edema.  Assessment:   Vaginitis    Plan:   Discharge suggestive of yeast, diflucan ordered. Pt instructed to sustain for clobetasol until she has taken medication for yeast. Also suggested that she use boric acid to help maintain natural PH balance. PT verbalize understanding and agrees. Will follow up with results of Nuswab.  Doreene Burke, CNM

## 2017-05-26 NOTE — Progress Notes (Signed)
Pt is here with c/o painful vaginal area-swollen feels "hot" and irritated.

## 2017-06-01 ENCOUNTER — Telehealth: Payer: Self-pay | Admitting: Obstetrics and Gynecology

## 2017-06-01 LAB — NUSWAB VAGINITIS PLUS (VG+)
CHLAMYDIA TRACHOMATIS, NAA: NEGATIVE
Candida albicans, NAA: POSITIVE — AB
Candida glabrata, NAA: NEGATIVE
Neisseria gonorrhoeae, NAA: NEGATIVE
TRICH VAG BY NAA: NEGATIVE

## 2017-06-01 NOTE — Telephone Encounter (Signed)
Patient called requesting labs for A1C and glucose. She states she has been having recurrent yeast infections and didn't know if it its related to glucose. Thanks

## 2017-06-01 NOTE — Telephone Encounter (Signed)
pls advise

## 2017-06-01 NOTE — Telephone Encounter (Signed)
Both were normal. It may be that the yeast infections are not going away completely. She can come in and let me check if she'd like.

## 2017-06-05 ENCOUNTER — Telehealth: Payer: Self-pay

## 2017-06-05 NOTE — Telephone Encounter (Signed)
Voicemail message left for pt -re: providers instructions. 

## 2017-06-05 NOTE — Telephone Encounter (Signed)
Spoke with pt

## 2017-06-09 ENCOUNTER — Encounter: Payer: Self-pay | Admitting: Obstetrics and Gynecology

## 2017-06-09 ENCOUNTER — Ambulatory Visit (INDEPENDENT_AMBULATORY_CARE_PROVIDER_SITE_OTHER): Payer: Managed Care, Other (non HMO) | Admitting: Obstetrics and Gynecology

## 2017-06-09 VITALS — BP 119/84 | HR 81 | Ht 64.0 in | Wt 226.9 lb

## 2017-06-09 DIAGNOSIS — E559 Vitamin D deficiency, unspecified: Secondary | ICD-10-CM

## 2017-06-09 DIAGNOSIS — Z01419 Encounter for gynecological examination (general) (routine) without abnormal findings: Secondary | ICD-10-CM

## 2017-06-09 DIAGNOSIS — Z800 Family history of malignant neoplasm of digestive organs: Secondary | ICD-10-CM | POA: Diagnosis not present

## 2017-06-09 MED ORDER — TERCONAZOLE 0.4 % VA CREA: 1.0000 | TOPICAL_CREAM | Freq: Every day | VAGINAL | 3 refills | Status: DC

## 2017-06-09 MED ORDER — CLOBETASOL PROPIONATE 0.05 % EX OINT: 1.0000 "application " | TOPICAL_OINTMENT | Freq: Two times a day (BID) | CUTANEOUS | 2 refills | Status: DC

## 2017-06-09 NOTE — Patient Instructions (Signed)
Preventive Care 18-39 Years, Female Preventive care refers to lifestyle choices and visits with your health care provider that can promote health and wellness. What does preventive care include?  A yearly physical exam. This is also called an annual well check.  Dental exams once or twice a year.  Routine eye exams. Ask your health care provider how often you should have your eyes checked.  Personal lifestyle choices, including: ? Daily care of your teeth and gums. ? Regular physical activity. ? Eating a healthy diet. ? Avoiding tobacco and drug use. ? Limiting alcohol use. ? Practicing safe sex. ? Taking vitamin and mineral supplements as recommended by your health care provider. What happens during an annual well check? The services and screenings done by your health care provider during your annual well check will depend on your age, overall health, lifestyle risk factors, and family history of disease. Counseling Your health care provider may ask you questions about your:  Alcohol use.  Tobacco use.  Drug use.  Emotional well-being.  Home and relationship well-being.  Sexual activity.  Eating habits.  Work and work Statistician.  Method of birth control.  Menstrual cycle.  Pregnancy history.  Screening You may have the following tests or measurements:  Height, weight, and BMI.  Diabetes screening. This is done by checking your blood sugar (glucose) after you have not eaten for a while (fasting).  Blood pressure.  Lipid and cholesterol levels. These may be checked every 5 years starting at age 38.  Skin check.  Hepatitis C blood test.  Hepatitis B blood test.  Sexually transmitted disease (STD) testing.  BRCA-related cancer screening. This may be done if you have a family history of breast, ovarian, tubal, or peritoneal cancers.  Pelvic exam and Pap test. This may be done every 3 years starting at age 38. Starting at age 30, this may be done  every 5 years if you have a Pap test in combination with an HPV test.  Discuss your test results, treatment options, and if necessary, the need for more tests with your health care provider. Vaccines Your health care provider may recommend certain vaccines, such as:  Influenza vaccine. This is recommended every year.  Tetanus, diphtheria, and acellular pertussis (Tdap, Td) vaccine. You may need a Td booster every 10 years.  Varicella vaccine. You may need this if you have not been vaccinated.  HPV vaccine. If you are 39 or younger, you may need three doses over 6 months.  Measles, mumps, and rubella (MMR) vaccine. You may need at least one dose of MMR. You may also need a second dose.  Pneumococcal 13-valent conjugate (PCV13) vaccine. You may need this if you have certain conditions and were not previously vaccinated.  Pneumococcal polysaccharide (PPSV23) vaccine. You may need one or two doses if you smoke cigarettes or if you have certain conditions.  Meningococcal vaccine. One dose is recommended if you are age 68-21 years and a first-year college student living in a residence hall, or if you have one of several medical conditions. You may also need additional booster doses.  Hepatitis A vaccine. You may need this if you have certain conditions or if you travel or work in places where you may be exposed to hepatitis A.  Hepatitis B vaccine. You may need this if you have certain conditions or if you travel or work in places where you may be exposed to hepatitis B.  Haemophilus influenzae type b (Hib) vaccine. You may need this  if you have certain risk factors.  Talk to your health care provider about which screenings and vaccines you need and how often you need them. This information is not intended to replace advice given to you by your health care provider. Make sure you discuss any questions you have with your health care provider. Document Released: 05/03/2001 Document Revised:  11/25/2015 Document Reviewed: 01/06/2015 Elsevier Interactive Patient Education  2018 Elsevier Inc.  

## 2017-06-09 NOTE — Progress Notes (Signed)
Subjective:   Terri Saunders is a 47 y.o. G1P0 Caucasian female here for a routine well-woman exam.  Patient's last menstrual period was 06/08/2017.    Current complaints: 2 months of recurrent yeast infections.  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 Tobacco/alcohol: no tobacco use Illicit drugs: no history of illicit drug use  The following portions of the patient's history were reviewed and updated as appropriate: allergies, current medications, past family history, past medical history, past social history, past surgical history and problem list.  Past Medical History History reviewed. No pertinent past medical history.  Past Surgical History Past Surgical History:  Procedure Laterality Date  . DILATION AND CURETTAGE OF UTERUS  2003   following a miscarriage  . LAPAROSCOPY  2004   diagnostic for infertility work up    Gynecologic History G1P0  Patient's last menstrual period was 06/08/2017. Contraception: none Last Pap: 2018. Results were: normal Last mammogram: 2018. Results were: normal   Obstetric History OB History  Gravida Para Term Preterm AB Living  1            SAB TAB Ectopic Multiple Live Births               # Outcome Date GA Lbr Len/2nd Weight Sex Delivery Anes PTL Lv  1 Gravida 2006    M Vag-Spont       Current Medications Current Outpatient Medications on File Prior to Visit  Medication Sig Dispense Refill  . clobetasol ointment (TEMOVATE) 0.05 % Apply 1 application topically 2 (two) times daily. Apply to affected area (Patient not taking: Reported on 06/09/2017) 30 g 2   No current facility-administered medications on file prior to visit.     Review of Systems Patient denies any headaches, blurred vision, shortness of breath, chest pain, abdominal pain, problems with bowel movements, urination, or intercourse.  Objective:   BP 119/84   Pulse 81   Ht 5\' 4"  (1.626 m)   Wt 226 lb 14.4 oz (102.9 kg)   LMP 06/08/2017   BMI 38.95 kg/m  Physical Exam  General:  Well developed, well nourished, no acute distress. She is alert and oriented x3. Skin:  Warm and dry Neck:  Midline trachea, no thyromegaly or nodules Cardiovascular: Regular rate and rhythm, no murmur heard Lungs:  Effort normal, all lung fields clear to auscultation bilaterally Breasts:  No dominant palpable mass, retraction, or nipple discharge Abdomen:  Soft, non tender, no hepatosplenomegaly or masses Pelvic:  External genitalia is normal in appearance.  The vagina is normal in appearance. The cervix is bulbous, no CMT.  Thin prep pap is not done . Uterus is felt to be normal size, shape, and contour.  No adnexal masses or tenderness noted. Extremities:  No swelling or varicosities noted Psych:  She has a normal mood and affect  Assessment:   Healthy well-woman exam Obesity Recurrent yeast lichens Family history of colon cancer in maternal GF,GM, Aunt, & Uncle  Plan:  Labs obtained-will follow up accordingly. Referred to GI for first screening colonoscopy terazol RX sent for prn use.            F/U 1 year for AE, or sooner if needed Mammogram ordered  Terri Saunders Terri NailerN Charan Saunders, CNM

## 2017-06-10 LAB — COMPREHENSIVE METABOLIC PANEL
A/G RATIO: 1.4 (ref 1.2–2.2)
ALT: 12 IU/L (ref 0–32)
AST: 14 IU/L (ref 0–40)
Albumin: 4 g/dL (ref 3.5–5.5)
Alkaline Phosphatase: 97 IU/L (ref 39–117)
BUN/Creatinine Ratio: 16 (ref 9–23)
BUN: 14 mg/dL (ref 6–24)
Bilirubin Total: 0.3 mg/dL (ref 0.0–1.2)
CALCIUM: 9.1 mg/dL (ref 8.7–10.2)
CHLORIDE: 107 mmol/L — AB (ref 96–106)
CO2: 23 mmol/L (ref 20–29)
Creatinine, Ser: 0.85 mg/dL (ref 0.57–1.00)
GFR calc Af Amer: 96 mL/min/{1.73_m2} (ref >59)
GFR, EST NON AFRICAN AMERICAN: 83 mL/min/{1.73_m2} (ref >59)
Globulin, Total: 2.8 g/dL (ref 1.5–4.5)
Glucose: 82 mg/dL (ref 65–99)
POTASSIUM: 4.3 mmol/L (ref 3.5–5.2)
Sodium: 145 mmol/L — ABNORMAL HIGH (ref 134–144)
Total Protein: 6.8 g/dL (ref 6.0–8.5)

## 2017-06-10 LAB — LIPID PANEL
Chol/HDL Ratio: 3.2 ratio (ref 0.0–4.4)
Cholesterol, Total: 152 mg/dL (ref 100–199)
HDL: 48 mg/dL (ref >39)
LDL Calculated: 92 mg/dL (ref 0–99)
TRIGLYCERIDES: 60 mg/dL (ref 0–149)
VLDL Cholesterol Cal: 12 mg/dL (ref 5–40)

## 2017-06-10 LAB — VITAMIN D 25 HYDROXY (VIT D DEFICIENCY, FRACTURES): VIT D 25 HYDROXY: 23.8 ng/mL — AB (ref 30.0–100.0)

## 2017-06-10 LAB — HEMOGLOBIN A1C
Est. average glucose Bld gHb Est-mCnc: 103 mg/dL
HEMOGLOBIN A1C: 5.2 % (ref 4.8–5.6)

## 2017-06-21 ENCOUNTER — Encounter: Payer: Self-pay | Admitting: *Deleted

## 2017-10-19 ENCOUNTER — Other Ambulatory Visit: Payer: Self-pay | Admitting: Certified Nurse Midwife

## 2018-01-05 ENCOUNTER — Telehealth: Payer: Self-pay | Admitting: Family Medicine

## 2018-01-05 DIAGNOSIS — M545 Low back pain, unspecified: Secondary | ICD-10-CM | POA: Insufficient documentation

## 2018-01-05 NOTE — Telephone Encounter (Signed)
Checking the status of the [employer] appeal form that was dropped off for [place] to fill out.  Please call pt when ready for pickup.  Thanks, [employer]

## 2018-01-08 NOTE — Telephone Encounter (Signed)
Pt has appointment on 01/09/18 to complete forms with dennis.  dbs,cma

## 2018-01-09 ENCOUNTER — Ambulatory Visit: Payer: Managed Care, Other (non HMO) | Admitting: Family Medicine

## 2018-01-09 ENCOUNTER — Other Ambulatory Visit: Payer: Self-pay

## 2018-01-09 ENCOUNTER — Encounter: Payer: Self-pay | Admitting: Family Medicine

## 2018-01-09 VITALS — BP 130/90 | HR 83 | Temp 98.0°F | Ht 64.0 in | Wt 232.4 lb

## 2018-01-09 DIAGNOSIS — M5126 Other intervertebral disc displacement, lumbar region: Secondary | ICD-10-CM

## 2018-01-09 DIAGNOSIS — E669 Obesity, unspecified: Secondary | ICD-10-CM

## 2018-01-09 NOTE — Progress Notes (Signed)
Patient: Terri Saunders Female    DOB: 21-Jan-1971   47 y.o.   MRN: [id number] Visit Date: 01/09/2018  Today's Provider: Dortha Kern, PA   Chief Complaint  Patient presents with  . appeal form to be completed   Subjective:    HPI  Pt reports she is here to have her appeal form completed for her work. BMI high and need to discuss weight loss program. Difficult to exercise with back pain from "ruptured disk". Had annual GYN physical at [facility] with [facility], CNM.     Past Medical History:  Diagnosis Date  . Bursitis   . Ruptured disk    Past Surgical History:  Procedure Laterality Date  . DILATION AND CURETTAGE OF UTERUS  2003   following a miscarriage  . LAPAROSCOPY  2004   diagnostic for infertility work up   Family History  Problem Relation Age of Onset  . Hyperlipidemia Mother   . Irritable bowel syndrome Brother   . Colon cancer Maternal Grandmother   . Heart disease Maternal Grandfather   . Colon cancer Maternal Grandfather   . Coronary artery disease Paternal Grandmother   . Hypertension Paternal Grandmother   . Heart attack Paternal Grandfather    Allergies  Allergen Reactions  . Sulfa Antibiotics Hives    Current Outpatient Medications:  .  clobetasol ointment (TEMOVATE) 0.05 %, Apply 1 application topically 2 (two) times daily. Apply to affected area, Disp: 30 g, Rfl: 2 .  terconazole (TERAZOL 7) 0.4 % vaginal cream, Place 1 applicator vaginally at bedtime., Disp: 45 g, Rfl: 3 .  gabapentin (NEURONTIN) 300 MG capsule, TAKE 1 CAPSULE BY MOUTH ONCE DAILY IN THE MORNING AND 2 CAPSULES ONCE DAILY AT NIGHT, Disp: , Rfl: 3 .  indomethacin (INDOCIN) 25 MG capsule, indomethacin 25 mg capsule  TAKE 1 CAPSULE BY MOUTH TWICE DAILY FOR 30 DAYS, Disp: , Rfl:   Review of Systems  Constitutional: Negative.   HENT: Negative.   Eyes: Negative.   Respiratory: Negative.   Cardiovascular: Negative.   Gastrointestinal: Negative.   Endocrine:  Negative.   Genitourinary: Negative.   Musculoskeletal: Negative.   Skin: Negative.   Allergic/Immunologic: Negative.   Neurological: Negative.   Hematological: Negative.   Psychiatric/Behavioral: Negative.    Social History   Tobacco Use  . Smoking status: Former Games developer  . Smokeless tobacco: Never Used  Substance Use Topics  . Alcohol use: No    Alcohol/week: 0.0 standard drinks   Objective:   BP 130/90 (BP Location: Right Arm, Patient Position: Sitting, Cuff Size: Large)   Pulse 83   Temp 98 F (36.7 C) (Oral)   Ht 5\' 4"  (1.626 m)   Wt 232 lb 6.4 oz (105.4 kg)   SpO2 98%   BMI 39.89 kg/m  Vitals:   01/09/18 1438  BP: 130/90  Pulse: 83  Temp: 98 F (36.7 C)  TempSrc: Oral  SpO2: 98%  Weight: 232 lb 6.4 oz (105.4 kg)  Height: 5\' 4"  (1.626 m)   Physical Exam  Constitutional: She is oriented to person, place, and time. She appears well-developed and well-nourished. No distress.  HENT:  Head: Normocephalic and atraumatic.  Right Ear: Hearing and external ear normal.  Left Ear: Hearing and external ear normal.  Nose: Nose normal.  Mouth/Throat: Oropharynx is clear and moist.  Eyes: Conjunctivae, EOM and lids are normal. Right eye exhibits no discharge. Left eye exhibits no discharge. No scleral icterus.  Neck:  Neck supple. No thyromegaly present.  Pulmonary/Chest: Effort normal. No respiratory distress.  Abdominal: Bowel sounds are normal.  Musculoskeletal: Normal range of motion.  Lymphadenopathy:    She has no cervical adenopathy.  Neurological: She is alert and oriented to person, place, and time.  Skin: Skin is intact. No lesion and no rash noted.  Psychiatric: She has a normal mood and affect. Her speech is normal and behavior is normal. Thought content normal.      Assessment & Plan:     1. Obesity, Class II, BMI 35-39.9 Completed appeal form for insurance at work. Counseled regarding 1400 calorie diet plan and goal of 20-25 lb weight loss in the next  2-3 months. Unable to exercise much with her back pain from lumbar HNP. Recheck progress in 3 months. Screening labs on 11-06-17 showed Hgb A1C 5.4%, creatinine 0.90, GFR 77, total cholesterol 144, triglycerides 59, HDL 40, LDL 92 with ratio of 3.6.  2. Lumbar herniated disc Followed by Dr. Darrelyn Hillock (orthopedic surgeon) with history of L5-S1 HNP. Has been treated with Gabapenten 300 mg 1 cap q am and 2 cap q pm with prednisone taper and Indomethacin for 30 days. Also, given cortisone injection on 19-18-19 for right trochanteric bursitis. Recommend she continue follow up with Dr. Darrelyn Hillock as planned.       Dortha Kern, PA  [facility]

## 2018-03-28 ENCOUNTER — Telehealth: Payer: Self-pay | Admitting: Obstetrics and Gynecology

## 2018-03-28 NOTE — Telephone Encounter (Signed)
Called pt she is coming in 03/29/18, MNS

## 2018-03-28 NOTE — Telephone Encounter (Signed)
The patient called and stated that she would like to speak with Amy in regards to her coming into the office today for having vaginal irritation for a week now. The patient also stated that she does not have a preference of who she see's today. And is hoping to receive a call sometime this morning. No other information was disclosed. Please advise.

## 2018-03-29 ENCOUNTER — Encounter: Payer: Self-pay | Admitting: Obstetrics and Gynecology

## 2018-03-29 ENCOUNTER — Ambulatory Visit: Payer: Managed Care, Other (non HMO) | Admitting: Obstetrics and Gynecology

## 2018-03-29 VITALS — BP 140/90 | HR 98 | Ht 64.0 in | Wt 236.7 lb

## 2018-03-29 DIAGNOSIS — N898 Other specified noninflammatory disorders of vagina: Secondary | ICD-10-CM

## 2018-03-29 DIAGNOSIS — B373 Candidiasis of vulva and vagina: Secondary | ICD-10-CM

## 2018-03-29 DIAGNOSIS — B3731 Acute candidiasis of vulva and vagina: Secondary | ICD-10-CM

## 2018-03-29 MED ORDER — TERCONAZOLE 0.4 % VA CREA: 1.0000 | TOPICAL_CREAM | Freq: Every day | VAGINAL | 3 refills | Status: DC

## 2018-03-29 MED ORDER — PROBIOTIC DAILY PO CAPS: 1.0000 | ORAL_CAPSULE | Freq: Every day | ORAL | 1 refills | Status: DC

## 2018-03-29 NOTE — Patient Instructions (Addendum)
Probiotics Probiotics are the good bacteria and yeasts that live in your body and keep your digestive system healthy. Probiotics also help your body's defense system (immune system) and protect your body against the growth of harmful bacteria. Your health care provider may recommend taking a probiotic if you are taking antibiotics or have certain medical conditions, such as:  Diarrhea.  Constipation.  Irritable bowel syndrome.  Lung infections.  Yeast infections.  Acne, eczema, and other skin conditions.  Frequent urinary tract infections. What affects the balance of bacteria in my body? The balance of good bacteria in your body can be affected by:  Antibiotic medicines. These medicines treat infections caused by bacteria. Unfortunately, they may kill the good bacteria in your body as well as the bad bacteria.  Certain medical conditions. Conditions related to an imbalance of bacteria include: ? Stomach and intestine (gastrointestinal) infections. ? Lung infections. ? Skin infections. ? Vaginal infections. ? Inflammatory bowel diseases. ? Stomach ulcers (gastric ulcers). ? Tooth decay and gum disease (periodontal disease).  Stress.  Poor diet. What type of probiotic is right for me? Probiotics contain different types of bacteria (strains). Strains commonly found in probiotics include:  Lactobacillus.  Saccharomyces.  Bifidobacterium. Specific strains have been shown to be more effective for certain health conditions. Ask your health care provider which strain or strains you should use and how often. Probiotics come in many different forms, strain combinations, and strengths. Some may need to be refrigerated. Always read the label for storage and usage instructions. Certain foods, such as yogurt, contain probiotics. Probiotics can also be bought as a supplement at a pharmacy, health food store, or grocery store. Talk to your health care provider before starting any  supplement. What are the side effects of probiotics? Some people have side effects when taking probiotics. Side effects are usually temporary and may include:  Gas.  Bloating.  Cramping. Serious side effects are rare. Follow these instructions at home:   If you are taking probiotics with antibiotics: ? Wait at least 2 hours between taking your medicine and the probiotic. ? Eat foods high in fiber, such as whole grains, beans, and vegetables. These foods can help good bacteria grow. ? Avoid certain foods as told by your health care provider. Summary  Probiotics are the good bacteria and yeasts that live in your body and keep you and your digestive system healthy.  Certain foods, such as yogurt, contain probiotics.  Probiotics can be taken as supplements. They can be bought at a pharmacy, health food store, or grocery store. They come in many different forms, strain combinations, and strengths.  Be sure to talk with your health care provider before taking a probiotic supplement. This information is not intended to replace advice given to you by your health care provider. Make sure you discuss any questions you have with your health care provider. Document Released: 10/02/2013 Document Revised: 11/24/2017 Document Reviewed: 03/22/2017 Elsevier Interactive Patient Education  2019 Elsevier Inc. Vaginal Yeast infection, Adult  Vaginal yeast infection is a condition that causes vaginal discharge as well as soreness, swelling, and redness (inflammation) of the vagina. This is a common condition. Some women get this infection frequently. What are the causes? This condition is caused by a change in the normal balance of the yeast (candida) and bacteria that live in the vagina. This change causes an overgrowth of yeast, which causes the inflammation. What increases the risk? The condition is more likely to develop in women who:  Take antibiotic  medicines.  Have diabetes.  Take birth  control pills.  Are pregnant.  Douche often.  Have a weak body defense system (immune system).  Have been taking steroid medicines for a long time.  Frequently wear tight clothing. What are the signs or symptoms? Symptoms of this condition include:  White, thick, creamy vaginal discharge.  Swelling, itching, redness, and irritation of the vagina. The lips of the vagina (vulva) may be affected as well.  Pain or a burning feeling while urinating.  Pain during sex. How is this diagnosed? This condition is diagnosed based on:  Your medical history.  A physical exam.  A pelvic exam. Your health care provider will examine a sample of your vaginal discharge under a microscope. Your health care provider may send this sample for testing to confirm the diagnosis. How is this treated? This condition is treated with medicine. Medicines may be over-the-counter or prescription. You may be told to use one or more of the following:  Medicine that is taken by mouth (orally).  Medicine that is applied as a cream (topically).  Medicine that is inserted directly into the vagina (suppository). Follow these instructions at home:  Lifestyle  Do not have sex until your health care provider approves. Tell your sex partner that you have a yeast infection. That person should go to his or her health care provider and ask if they should also be treated.  Do not wear tight clothes, such as pantyhose or tight pants.  Wear breathable cotton underwear. General instructions  Take or apply over-the-counter and prescription medicines only as told by your health care provider.  Eat more yogurt. This may help to keep your yeast infection from returning.  Do not use tampons until your health care provider approves.  Try taking a sitz bath to help with discomfort. This is a warm water bath that is taken while you are sitting down. The water should only come up to your hips and should cover your  buttocks. Do this 3-4 times per day or as told by your health care provider.  Do not douche.  If you have diabetes, keep your blood sugar levels under control.  Keep all follow-up visits as told by your health care provider. This is important. Contact a health care provider if:  You have a fever.  Your symptoms go away and then return.  Your symptoms do not get better with treatment.  Your symptoms get worse.  You have new symptoms.  You develop blisters in or around your vagina.  You have blood coming from your vagina and it is not your menstrual period.  You develop pain in your abdomen. Summary  Vaginal yeast infection is a condition that causes discharge as well as soreness, swelling, and redness (inflammation) of the vagina.  This condition is treated with medicine. Medicines may be over-the-counter or prescription.  Take or apply over-the-counter and prescription medicines only as told by your health care provider.  Do not douche. Do not have sex or use tampons until your health care provider approves.  Contact a health care provider if your symptoms do not get better with treatment or your symptoms go away and then return. This information is not intended to replace advice given to you by your health care provider. Make sure you discuss any questions you have with your health care provider. Document Released: 12/15/2004 Document Revised: 07/24/2017 Document Reviewed: 07/24/2017 Elsevier Interactive Patient Education  2019 Elsevier Inc.   vagasil pH wash to genital area

## 2018-03-29 NOTE — Progress Notes (Signed)
  Subjective:     Patient ID: Terri Saunders, female   DOB: 27-Oct-1970, 48 y.o.   MRN: [id number]  HPI Here with c/o vaginal irritation and itching for one week. Occurring monthly. Used diflucan in November and it helped. Typically occurs within a week of menses. Worse after sex. Hasn't used clobex recently. No vaginal discharge until yesterday and has had watery clear to white discharge, now feels swollen.             Review of Systems  All other systems reviewed and are negative.      Objective:   Physical Exam A&Ox4 Well groomed female in no distress Blood pressure 140/90, pulse 98, height 5\' 4"  (1.626 m), weight 236 lb 11.2 oz (107.4 kg), last menstrual period 03/08/2018. Pelvic exam: VULVA: vulvar erythema throughout labia minora and clitorus, VAGINA: normal appearing vagina with normal color and discharge, no lesions, CERVIX: normal appearing cervix without discharge or lesions, WET MOUNT done - results: negative for pathogens, normal epithelial cells, hyphae, lactobacilli.    Assessment:     Yeast infection vulvitis    Plan:     terazol 7 prescribed, along with daily probiotic capsules.vagasil wash to genital area.  Reassured of normal findings.  Melody Shambley,CNM

## 2018-04-04 ENCOUNTER — Ambulatory Visit: Payer: Managed Care, Other (non HMO) | Admitting: Physician Assistant

## 2018-04-04 ENCOUNTER — Encounter: Payer: Self-pay | Admitting: Physician Assistant

## 2018-04-04 VITALS — BP 105/74 | HR 81 | Temp 98.6°F | Resp 16 | Wt 232.0 lb

## 2018-04-04 DIAGNOSIS — R609 Edema, unspecified: Secondary | ICD-10-CM

## 2018-04-04 DIAGNOSIS — R030 Elevated blood-pressure reading, without diagnosis of hypertension: Secondary | ICD-10-CM | POA: Diagnosis not present

## 2018-04-04 NOTE — Patient Instructions (Addendum)
Hypertension  Hypertension is another name for high blood pressure. High blood pressure forces your heart to work harder to pump blood. This can cause problems over time.  There are two numbers in a blood pressure reading. There is a top number (systolic) over a bottom number (diastolic). It is best to have a blood pressure below 120/80. Healthy choices can help lower your blood pressure. You may need medicine to help lower your blood pressure if:   Your blood pressure cannot be lowered with healthy choices.   Your blood pressure is higher than 130/80.  Follow these instructions at home:  Eating and drinking     If directed, follow the DASH eating plan. This diet includes:  ? Filling half of your plate at each meal with fruits and vegetables.  ? Filling one quarter of your plate at each meal with whole grains. Whole grains include whole wheat pasta, brown rice, and whole grain bread.  ? Eating or drinking low-fat dairy products, such as skim milk or low-fat yogurt.  ? Filling one quarter of your plate at each meal with low-fat (lean) proteins. Low-fat proteins include fish, skinless chicken, eggs, beans, and tofu.  ? Avoiding fatty meat, cured and processed meat, or chicken with skin.  ? Avoiding premade or processed food.   Eat less than 1,500 mg of salt (sodium) a day.   Limit alcohol use to no more than 1 drink a day for nonpregnant women and 2 drinks a day for men. One drink equals 12 oz of beer, 5 oz of wine, or 1 oz of hard liquor.  Lifestyle   Work with your doctor to stay at a healthy weight or to lose weight. Ask your doctor what the best weight is for you.   Get at least 30 minutes of exercise that causes your heart to beat faster (aerobic exercise) most days of the week. This may include walking, swimming, or biking.   Get at least 30 minutes of exercise that strengthens your muscles (resistance exercise) at least 3 days a week. This may include lifting weights or pilates.   Do not use any  products that contain nicotine or tobacco. This includes cigarettes and e-cigarettes. If you need help quitting, ask your doctor.   Check your blood pressure at home as told by your doctor.   Keep all follow-up visits as told by your doctor. This is important.  Medicines   Take over-the-counter and prescription medicines only as told by your doctor. Follow directions carefully.   Do not skip doses of blood pressure medicine. The medicine does not work as well if you skip doses. Skipping doses also puts you at risk for problems.   Ask your doctor about side effects or reactions to medicines that you should watch for.  Contact a doctor if:   You think you are having a reaction to the medicine you are taking.   You have headaches that keep coming back (recurring).   You feel dizzy.   You have swelling in your ankles.   You have trouble with your vision.  Get help right away if:   You get a very bad headache.   You start to feel confused.   You feel weak or numb.   You feel faint.   You get very bad pain in your:  ? Chest.  ? Belly (abdomen).   You throw up (vomit) more than once.   You have trouble breathing.  Summary   Hypertension is another   name for high blood pressure.   Making healthy choices can help lower blood pressure. If your blood pressure cannot be controlled with healthy choices, you may need to take medicine.  This information is not intended to replace advice given to you by your health care provider. Make sure you discuss any questions you have with your health care provider.  Document Released: 08/24/2007 Document Revised: 02/03/2016 Document Reviewed: 02/03/2016  Elsevier Interactive Patient Education  2019 [employer] Inc[employer]  DASH Eating Plan  DASH stands for "Dietary Approaches to Stop Hypertension." The DASH eating plan is a healthy eating plan that has been shown to reduce high blood pressure (hypertension). It may also reduce your risk for type 2 diabetes, heart disease, and  stroke. The DASH eating plan may also help with weight loss.  What are tips for following this plan?    General guidelines   Avoid eating more than 2,300 mg (milligrams) of salt (sodium) a day. If you have hypertension, you may need to reduce your sodium intake to 1,500 mg a day.   Limit alcohol intake to no more than 1 drink a day for nonpregnant women and 2 drinks a day for men. One drink equals 12 oz of beer, 5 oz of wine, or 1 oz of hard liquor.   Work with your health care provider to maintain a healthy body weight or to lose weight. Ask what an ideal weight is for you.   Get at least 30 minutes of exercise that causes your heart to beat faster (aerobic exercise) most days of the week. Activities may include walking, swimming, or biking.   Work with your health care provider or diet and nutrition specialist (dietitian) to adjust your eating plan to your individual calorie needs.  Reading food labels     Check food labels for the amount of sodium per serving. Choose foods with less than 5 percent of the Daily Value of sodium. Generally, foods with less than 300 mg of sodium per serving fit into this eating plan.   To find whole grains, look for the word "whole" as the first word in the ingredient list.  Shopping   Buy products labeled as "low-sodium" or "no salt added."   Buy fresh foods. Avoid canned foods and premade or frozen meals.  Cooking   Avoid adding salt when cooking. Use salt-free seasonings or herbs instead of table salt or sea salt. Check with your health care provider or pharmacist before using salt substitutes.   Do not fry foods. Cook foods using healthy methods such as baking, boiling, grilling, and broiling instead.   Cook with heart-healthy oils, such as olive, canola, soybean, or sunflower oil.  Meal planning   Eat a balanced diet that includes:  ? 5 or more servings of fruits and vegetables each day. At each meal, try to fill half of your plate with fruits and vegetables.  ? Up  to 6-8 servings of whole grains each day.  ? Less than 6 oz of lean meat, poultry, or fish each day. A 3-oz serving of meat is about the same size as a deck of cards. One egg equals 1 oz.  ? 2 servings of low-fat dairy each day.  ? A serving of nuts, seeds, or beans 5 times each week.  ? Heart-healthy fats. Healthy fats called Omega-3 fatty acids are found in foods such as flaxseeds and coldwater fish, like sardines, salmon, and mackerel.   Limit how much you eat of the following:  ?   Canned or prepackaged foods.  ? Food that is high in trans fat, such as fried foods.  ? Food that is high in saturated fat, such as fatty meat.  ? Sweets, desserts, sugary drinks, and other foods with added sugar.  ? Full-fat dairy products.   Do not salt foods before eating.   Try to eat at least 2 vegetarian meals each week.   Eat more home-cooked food and less restaurant, buffet, and fast food.   When eating at a restaurant, ask that your food be prepared with less salt or no salt, if possible.  What foods are recommended?  The items listed may not be a complete list. Talk with your dietitian about what dietary choices are best for you.  Grains  Whole-grain or whole-wheat bread. Whole-grain or whole-wheat pasta. Brown rice. Oatmeal. Quinoa. Bulgur. Whole-grain and low-sodium cereals. Pita bread. Low-fat, low-sodium crackers. Whole-wheat flour tortillas.  Vegetables  Fresh or frozen vegetables (raw, steamed, roasted, or grilled). Low-sodium or reduced-sodium tomato and vegetable juice. Low-sodium or reduced-sodium tomato sauce and tomato paste. Low-sodium or reduced-sodium canned vegetables.  Fruits  All fresh, dried, or frozen fruit. Canned fruit in natural juice (without added sugar).  Meat and other protein foods  Skinless chicken or turkey. Ground chicken or turkey. Pork with fat trimmed off. Fish and seafood. Egg whites. Dried beans, peas, or lentils. Unsalted nuts, nut butters, and seeds. Unsalted canned beans. Lean cuts of  beef with fat trimmed off. Low-sodium, lean deli meat.  Dairy  Low-fat (1%) or fat-free (skim) milk. Fat-free, low-fat, or reduced-fat cheeses. Nonfat, low-sodium ricotta or cottage cheese. Low-fat or nonfat yogurt. Low-fat, low-sodium cheese.  Fats and oils  Soft margarine without trans fats. Vegetable oil. Low-fat, reduced-fat, or light mayonnaise and salad dressings (reduced-sodium). Canola, safflower, olive, soybean, and sunflower oils. Avocado.  Seasoning and other foods  Herbs. Spices. Seasoning mixes without salt. Unsalted popcorn and pretzels. Fat-free sweets.  What foods are not recommended?  The items listed may not be a complete list. Talk with your dietitian about what dietary choices are best for you.  Grains  Baked goods made with fat, such as croissants, muffins, or some breads. Dry pasta or rice meal packs.  Vegetables  Creamed or fried vegetables. Vegetables in a cheese sauce. Regular canned vegetables (not low-sodium or reduced-sodium). Regular canned tomato sauce and paste (not low-sodium or reduced-sodium). Regular tomato and vegetable juice (not low-sodium or reduced-sodium). Pickles. Olives.  Fruits  Canned fruit in a light or heavy syrup. Fried fruit. Fruit in cream or butter sauce.  Meat and other protein foods  Fatty cuts of meat. Ribs. Fried meat. Bacon. Sausage. Bologna and other processed lunch meats. Salami. Fatback. Hotdogs. Bratwurst. Salted nuts and seeds. Canned beans with added salt. Canned or smoked fish. Whole eggs or egg yolks. Chicken or turkey with skin.  Dairy  Whole or 2% milk, cream, and half-and-half. Whole or full-fat cream cheese. Whole-fat or sweetened yogurt. Full-fat cheese. Nondairy creamers. Whipped toppings. Processed cheese and cheese spreads.  Fats and oils  Butter. Stick margarine. Lard. Shortening. Ghee. Bacon fat. Tropical oils, such as coconut, palm kernel, or palm oil.  Seasoning and other foods  Salted popcorn and pretzels. Onion salt, garlic salt, seasoned  salt, table salt, and sea salt. Worcestershire sauce. Tartar sauce. Barbecue sauce. Teriyaki sauce. Soy sauce, including reduced-sodium. Steak sauce. Canned and packaged gravies. Fish sauce. Oyster sauce. Cocktail sauce. Horseradish that you find on the shelf. Ketchup. Mustard. Meat flavorings and tenderizers.   Bouillon cubes. Hot sauce and Tabasco sauce. Premade or packaged marinades. Premade or packaged taco seasonings. Relishes. Regular salad dressings.  Where to find more information:   National Heart, Lung, and Blood Institute: www.nhlbi.nih.gov   American Heart Association: www.heart.org  Summary   The DASH eating plan is a healthy eating plan that has been shown to reduce high blood pressure (hypertension). It may also reduce your risk for type 2 diabetes, heart disease, and stroke.   With the DASH eating plan, you should limit salt (sodium) intake to 2,300 mg a day. If you have hypertension, you may need to reduce your sodium intake to 1,500 mg a day.   When on the DASH eating plan, aim to eat more fresh fruits and vegetables, whole grains, lean proteins, low-fat dairy, and heart-healthy fats.   Work with your health care provider or diet and nutrition specialist (dietitian) to adjust your eating plan to your individual calorie needs.  This information is not intended to replace advice given to you by your health care provider. Make sure you discuss any questions you have with your health care provider.  Document Released: 02/24/2011 Document Revised: 02/29/2016 Document Reviewed: 02/29/2016  Elsevier Interactive Patient Education  2019 Elsevier Inc.

## 2018-04-04 NOTE — Progress Notes (Signed)
Patient: Terri Saunders Female    DOB: 27-Sep-1970   48 y.o.   MRN: [id number] Visit Date: 04/06/2018  Today's Provider: Trey Sailors, PA-C   Chief Complaint  Patient presents with  . Hypertension   Subjective:     HPI Patient here today c/o right foot swelling and elevated BP yesterday. No history of injuries, no painful. Denies shortness of breath. Swelling has resolved this morning. Works at Health and safety inspector job.  Patient reports she went to her mother's house who is an Charity fundraiser and her mother told her to take one dose of her father's Metoprolol succinate ER 25 mg. Patient denies any head aches, chest pain or shortness of breath.   BP Readings from Last 9 Encounters:  04/04/18 105/74  03/29/18 140/90  01/09/18 130/90  06/09/17 119/84  05/26/17 120/85  04/26/17 121/88  01/10/17 122/86  05/05/16 118/80  03/11/16 115/85     Allergies  Allergen Reactions  . Sulfa Antibiotics Hives     Current Outpatient Medications:  .  Probiotic Product (PROBIOTIC DAILY) CAPS, Take 1 capsule by mouth daily., Disp: 120 capsule, Rfl: 1 .  gabapentin (NEURONTIN) 300 MG capsule, TAKE 1 CAPSULE BY MOUTH ONCE DAILY IN THE MORNING AND 2 CAPSULES ONCE DAILY AT NIGHT, Disp: , Rfl: 3 .  indomethacin (INDOCIN) 25 MG capsule, indomethacin 25 mg capsule  TAKE 1 CAPSULE BY MOUTH TWICE DAILY FOR 30 DAYS, Disp: , Rfl:  .  terconazole (TERAZOL 7) 0.4 % vaginal cream, Place 1 applicator vaginally at bedtime. (Patient not taking: Reported on 04/04/2018), Disp: 45 g, Rfl: 3  Review of Systems  Constitutional: Negative.   Cardiovascular: Positive for leg swelling.  Neurological: Positive for numbness.    Social History   Tobacco Use  . Smoking status: Former Games developer  . Smokeless tobacco: Never Used  Substance Use Topics  . Alcohol use: No    Alcohol/week: 0.0 standard drinks      Objective:   BP 105/74 (BP Location: Left Arm, Patient Position: Sitting, Cuff Size: Large)   Pulse 81   Temp 98.6 F (37 C)  (Oral)   Resp 16   Wt 232 lb (105.2 kg)   LMP 03/08/2018   SpO2 97%   BMI 39.82 kg/m  Vitals:   04/04/18 0959  BP: 105/74  Pulse: 81  Resp: 16  Temp: 98.6 F (37 C)  TempSrc: Oral  SpO2: 97%  Weight: 232 lb (105.2 kg)     Physical Exam Constitutional:      Appearance: Normal appearance.  Cardiovascular:     Rate and Rhythm: Normal rate and regular rhythm.     Heart sounds: Normal heart sounds.  Pulmonary:     Effort: Pulmonary effort is normal.     Breath sounds: Normal breath sounds.  Musculoskeletal:     Right lower leg: Edema present.     Left lower leg: Edema present.     Comments: Mild 1+ edema in bilateral lower extremities   Skin:    General: Skin is warm and dry.  Neurological:     Mental Status: She is alert and oriented to person, place, and time.  Psychiatric:        Mood and Affect: Mood normal.        Behavior: Behavior normal.         Assessment & Plan    1. Peripheral edema  Counseled on compression stockings, elevating legs.  2. Elevated BP without diagnosis of hypertension  Counseled that her blood pressures the previous two office visits have been borderline HTN but all her other Bps have been very normal. Her BP today is low normal which I suspect is due to the metoprolol and so am unable to assess her true BP. Counseled that it is very unsafe to take medication not prescribed to her, particularly this blood pressure medication which can slow down her heart rate and cause complete heart block in certain situations. I suggest she discontinue this medication and return in one month for repeat blood pressure by Maurine Minister. She can keep a log of her blood pressures at home and bring this into office to determine if she is truly hypertensive and requires medication.    Return in about 1 month (around 05/05/2018) for blood pressure .  The entirety of the information documented in the History of Present Illness, Review of Systems and Physical Exam were  personally obtained by me. Portions of this information were initially documented by Rondel Baton, CMA and reviewed by me for thoroughness and accuracy.      Trey Sailors, PA-C  [facility]

## 2018-05-04 NOTE — Progress Notes (Deleted)
       Patient: Terri Saunders Female    DOB: [birth year]   48 y.o.   MRN: [id number] Visit Date: 05/04/2018  Today's Provider: Dortha Kern, PA   No chief complaint on file.  Subjective:     HPI   Peripheral edema From 04/04/2018-seen by Osvaldo Angst. Counseled on compression stockings, elevating legs.  Elevated BP without diagnosis of hypertension From 04/04/2018-seen by Osvaldo Angst. Counseled that her blood pressures the previous two office visits have been borderline HTN but all her other Bps have been very normal. Her BP today is low normal which I suspect is due to the metoprolol and so am unable to assess her true BP. Counseled that it is very unsafe to take medication not prescribed to her, particularly this blood pressure medication which can slow down her heart rate and cause complete heart block in certain situations. I suggest she discontinue this medication and return in one month for repeat blood pressure by Maurine Minister. She can keep a log of her blood pressures at home and bring this into office to determine if she is truly hypertensive and requires medication.   Advised to return in about 1 month (around 05/05/2018) for blood pressure .  Allergies  Allergen Reactions  . Sulfa Antibiotics Hives     Current Outpatient Medications:  .  gabapentin (NEURONTIN) 300 MG capsule, TAKE 1 CAPSULE BY MOUTH ONCE DAILY IN THE MORNING AND 2 CAPSULES ONCE DAILY AT NIGHT, Disp: , Rfl: 3 .  indomethacin (INDOCIN) 25 MG capsule, indomethacin 25 mg capsule  TAKE 1 CAPSULE BY MOUTH TWICE DAILY FOR 30 DAYS, Disp: , Rfl:  .  Probiotic Product (PROBIOTIC DAILY) CAPS, Take 1 capsule by mouth daily., Disp: 120 capsule, Rfl: 1 .  terconazole (TERAZOL 7) 0.4 % vaginal cream, Place 1 applicator vaginally at bedtime. (Patient not taking: Reported on 04/04/2018), Disp: 45 g, Rfl: 3  Review of Systems  Constitutional: Negative for appetite change, chills, fatigue and fever.  Respiratory: Negative  for chest tightness and shortness of breath.   Cardiovascular: Negative for chest pain and palpitations.  Gastrointestinal: Negative for abdominal pain, nausea and vomiting.  Neurological: Negative for dizziness and weakness.    Social History   Tobacco Use  . Smoking status: Former Games developer  . Smokeless tobacco: Never Used  Substance Use Topics  . Alcohol use: No    Alcohol/week: 0.0 standard drinks      Objective:   There were no vitals taken for this visit. There were no vitals filed for this visit.   Physical Exam      Assessment & Plan        Dortha Kern, PA  [facility]

## 2018-05-07 ENCOUNTER — Ambulatory Visit: Payer: Managed Care, Other (non HMO) | Admitting: Family Medicine

## 2019-02-12 ENCOUNTER — Other Ambulatory Visit: Payer: Self-pay

## 2019-02-12 ENCOUNTER — Encounter: Payer: Self-pay | Admitting: Family Medicine

## 2019-02-12 ENCOUNTER — Ambulatory Visit: Payer: Managed Care, Other (non HMO) | Admitting: Family Medicine

## 2019-02-12 VITALS — BP 132/88 | HR 80 | Temp 96.5°F | Wt 236.0 lb

## 2019-02-12 DIAGNOSIS — L900 Lichen sclerosus et atrophicus: Secondary | ICD-10-CM | POA: Diagnosis not present

## 2019-02-12 DIAGNOSIS — N760 Acute vaginitis: Secondary | ICD-10-CM

## 2019-02-12 DIAGNOSIS — B9689 Other specified bacterial agents as the cause of diseases classified elsewhere: Secondary | ICD-10-CM

## 2019-02-12 LAB — POCT WET PREP WITH KOH
Clue Cells Wet Prep HPF POC: POSITIVE
KOH Prep POC: NEGATIVE
RBC Wet Prep HPF POC: NEGATIVE
Trichomonas, UA: NEGATIVE
Yeast Wet Prep HPF POC: NEGATIVE

## 2019-02-12 MED ORDER — METRONIDAZOLE 0.75 % VA GEL: 1.0000 | Freq: Two times a day (BID) | VAGINAL | 0 refills | Status: DC

## 2019-02-12 NOTE — Progress Notes (Signed)
Terri Saunders  MRN: [id number] DOB: 18-Apr-1970  Subjective:  HPI   The patient is a 48 year old female who presents for evaluation of vaginal discharge.  She states she noticed 2 nights ago.  It is clear and sometimes is a little white with a mixture of mucus.  She denies any abdomen or pelvic, no fever, odor, burning or itching. Her LMP was 01/17/19 and has been regular for the last 3-4 months.  She does some months have irregular periods.  She reports having only one ovary.  Her partner has no complaints.  Patient Active Problem List   Diagnosis Date Noted  . Lumbar pain 01/05/2018  . Lichen sclerosus 96/29/5284  . Vitamin D deficiency 05/18/2016  . 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 Incomplete emptying of bladder 02/16/2016  . Lumbar herniated disc 11/05/2014  . Clinical depression 05/13/2005  . Malaise and fatigue 05/13/2005    Past Medical History:  Diagnosis Date  . Bursitis   . Ruptured disk     Social History   Socioeconomic History  . Marital status: Married    Spouse name: Not on file  . Number of children: Not on file  . Years of education: Not on file  . Highest education level: Not on file  Occupational History  . Not on file  Social Needs  . Financial resource strain: Not on file  . Food insecurity    Worry: Not on file    Inability: Not on file  . Transportation needs    Medical: Not on file    Non-medical: Not on file  Tobacco Use  . Smoking status: Former Research scientist (life sciences)  . Smokeless tobacco: Never Used  Substance and Sexual Activity  . Alcohol use: No    Alcohol/week: 0.0 standard drinks  . Drug use: No  . Sexual activity: Yes    Birth control/protection: None  Lifestyle  . Physical activity    Days per week: Not on file    Minutes per session: Not on file  . Stress: Not on file  Relationships  . Social Herbalist on phone: Not on file    Gets together: Not on file    Attends religious service: Not on file   Active member of club or organization: Not on file    Attends meetings of clubs or organizations: Not on file    Relationship status: Not on file  . Intimate partner violence    Fear of current or ex partner: Not on file    Emotionally abused: Not on file    Physically abused: Not on file    Forced sexual activity: Not on file  Other Topics Concern  . Not on file  Social History Narrative  . Not on file    Outpatient Encounter Medications as of 02/12/2019  Medication Sig  . Probiotic Product (PROBIOTIC DAILY) CAPS Take 1 capsule by mouth daily.  Marland Kitchen gabapentin (NEURONTIN) 300 MG capsule TAKE 1 CAPSULE BY MOUTH ONCE DAILY IN THE MORNING AND 2 CAPSULES ONCE DAILY AT NIGHT  . indomethacin (INDOCIN) 25 MG capsule indomethacin 25 mg capsule  TAKE 1 CAPSULE BY MOUTH TWICE DAILY FOR 30 DAYS  . terconazole (TERAZOL 7) 0.4 % vaginal cream Place 1 applicator vaginally at bedtime. (Patient not taking: Reported on 04/04/2018)   No facility-administered encounter medications on file as of 02/12/2019.     Allergies  Allergen Reactions  . Sulfa Antibiotics Hives    Review of  Systems  Constitutional: Negative for chills, diaphoresis, fever and malaise/fatigue.  HENT: Negative for congestion, ear pain, sinus pain and sore throat.   Respiratory: Negative for cough and shortness of breath.   Cardiovascular: Negative for chest pain.    Objective:  BP 132/88 (BP Location: Right Arm, Patient Position: Sitting, Cuff Size: Normal)   Pulse 80   Temp (!) 96.5 F (35.8 C) (Skin)   Wt 236 lb (107 kg)   SpO2 99%   BMI 40.51 kg/m   Physical Exam  Constitutional: She is oriented to person, place, and time and well-developed, well-nourished, and in no distress.  HENT:  Head: Normocephalic.  Eyes: Conjunctivae are normal.  Neck: Neck supple.  Cardiovascular: Normal rate.  Pulmonary/Chest: Effort normal and breath sounds normal.  Abdominal: Soft. Bowel sounds are normal.  Genitourinary:    Vaginal  discharge present.     Genitourinary Comments: Minimal white to gray discharge in the vaginal vault. No erythema. Area to rash (lichen sclerosus) right external vulva.   Musculoskeletal: Normal range of motion.  Neurological: She is alert and oriented to person, place, and time.  Skin: No rash noted.  Psychiatric: Mood, affect and judgment normal.    Assessment and Plan :   1. Bacterial vaginosis Some mild discharge over the past 2 nights. No itching, burning, dysuria, abdominal pain or dyspareunia. Wet prep and KOH slides negative for yeast but several clue cells with many bacteria. Will treat with Metrogel and may add Monistat or Gyne-Lotrimin if yeast appears over the holidays. Recheck prn. - metroNIDAZOLE (METROGEL VAGINAL) 0.75 % vaginal gel; Place 1 Applicatorful vaginally 2 (two) times daily.  Dispense: 70 g; Refill: 0 - POCT Wet Prep with KOH  2. Lichen sclerosus Mild rash still present. May use the Clobetasol prescribed by her GYN prn.

## 2021-06-23 ENCOUNTER — Emergency Department: Payer: Managed Care, Other (non HMO)

## 2021-06-23 ENCOUNTER — Observation Stay
Admission: EM | Admit: 2021-06-23 | Discharge: 2021-06-24 | Disposition: A | Discharge status: Home / Self Care | Payer: Managed Care, Other (non HMO) | Attending: Family Medicine | Admitting: Family Medicine

## 2021-06-23 DIAGNOSIS — E669 Obesity, unspecified: Secondary | ICD-10-CM

## 2021-06-23 DIAGNOSIS — E876 Hypokalemia: Secondary | ICD-10-CM

## 2021-06-23 DIAGNOSIS — I319 Disease of pericardium, unspecified: Secondary | ICD-10-CM | POA: Diagnosis not present

## 2021-06-23 DIAGNOSIS — R079 Chest pain, unspecified: Secondary | ICD-10-CM | POA: Diagnosis present

## 2021-06-23 DIAGNOSIS — R9431 Abnormal electrocardiogram [ECG] [EKG]: Secondary | ICD-10-CM

## 2021-06-23 DIAGNOSIS — E041 Nontoxic single thyroid nodule: Secondary | ICD-10-CM

## 2021-06-23 LAB — BASIC METABOLIC PANEL
Anion gap: 9 (ref 5–15)
BUN: 15 mg/dL (ref 6–20)
CO2: 23 mmol/L (ref 22–32)
Calcium: 8.5 mg/dL — ABNORMAL LOW (ref 8.9–10.3)
Chloride: 105 mmol/L (ref 98–111)
Creatinine, Ser: 0.96 mg/dL (ref 0.44–1.00)
GFR, Estimated: >60 mL/min (ref >60)
Glucose, Bld: 95 mg/dL (ref 70–99)
Potassium: 3.3 mmol/L — ABNORMAL LOW (ref 3.5–5.1)
Sodium: 137 mmol/L (ref 135–145)

## 2021-06-23 LAB — CBC
HCT: 36.4 % (ref 36.0–46.0)
Hemoglobin: 11.9 g/dL — ABNORMAL LOW (ref 12.0–15.0)
MCH: 28.7 pg (ref 26.0–34.0)
MCHC: 32.7 g/dL (ref 30.0–36.0)
MCV: 87.9 fL (ref 80.0–100.0)
Platelets: 299 10*3/uL (ref 150–400)
RBC: 4.14 MIL/uL (ref 3.87–5.11)
RDW: 12.7 % (ref 11.5–15.5)
WBC: 8.6 10*3/uL (ref 4.0–10.5)
nRBC: 0 % (ref 0.0–0.2)

## 2021-06-23 LAB — TROPONIN I (HIGH SENSITIVITY): Troponin I (High Sensitivity): 4 ng/L (ref <18)

## 2021-06-23 MED ORDER — NITROGLYCERIN 0.4 MG SL SUBL
0.4000 mg | SUBLINGUAL_TABLET | SUBLINGUAL | Status: DC | PRN
  Administered 2021-06-23 (×2): 0.4 mg via SUBLINGUAL
  Filled 2021-06-23: qty 1

## 2021-06-23 MED ORDER — LORAZEPAM 2 MG/ML IJ SOLN
1.0000 mg | Freq: Once | INTRAMUSCULAR | Status: AC
  Administered 2021-06-23: 1 mg via INTRAVENOUS
  Filled 2021-06-23: qty 1

## 2021-06-23 MED ORDER — ASPIRIN 81 MG PO CHEW
324.0000 mg | CHEWABLE_TABLET | Freq: Once | ORAL | Status: AC
  Administered 2021-06-23: 324 mg via ORAL
  Filled 2021-06-23: qty 4

## 2021-06-23 NOTE — ED Provider Notes (Signed)
? ?[facility] ?Provider Note ? ? ? Event Date/Time  ? First MD Initiated Contact with Patient 06/23/21 2201   ?  (approximate) ? ? ?History  ? ?Chest Pain ? ? ?HPI ? ?Terri Saunders is a 51 y.o. female  who presents to the emergency department today because of concerns for chest pain.  The patient states it is located in the center part of her chest.  She describes it as a pressure-like feeling.  She does state it makes it feel like it is hard for her to catch her breath.  She states that she did have some hand numbness when it first started.  She continues to have the pain at the time my exam.  She denies similar symptoms in the past.  Denies any recent unusual exertion. ?  ? ? ?Physical Exam  ? ?Triage Vital Signs: ?ED Triage Vitals  ?Enc Vitals Group  ?   BP 06/23/21 2127 (!) 167/105  ?   Pulse Rate 06/23/21 2127 74  ?   Resp 06/23/21 2127 18  ?   Temp 06/23/21 2127 98.4 ?F (36.9 ?C)  ?   Temp Source 06/23/21 2127 Oral  ?   SpO2 06/23/21 2127 100 %  ?   Weight 06/23/21 2127 230 lb (104.3 kg)  ?   Height 06/23/21 2127 5\' 4"  (1.626 m)  ?   Head Circumference --   ?   Peak Flow --   ?   Pain Score 06/23/21 2132 7  ? ?Most recent vital signs: ?Vitals:  ? 06/23/21 2127  ?BP: (!) 167/105  ?Pulse: 74  ?Resp: 18  ?Temp: 98.4 ?F (36.9 ?C)  ?SpO2: 100%  ? ? ?General: Awake, no distress.  ?CV:  Good peripheral perfusion. Regular rate and rhythm. No m/r/g ?Resp:  Normal effort. Lungs clear to auscultation ?Abd:  No distention.  ? ?ED Results / Procedures / Treatments  ? ?Labs ?(all labs ordered are listed, but only abnormal results are displayed) ?Labs Reviewed  ?BASIC METABOLIC PANEL - Abnormal; Notable for the following components:  ?    Result Value  ? Potassium 3.3 (*)   ? Calcium 8.5 (*)   ? All other components within normal limits  ?CBC - Abnormal; Notable for the following components:  ? Hemoglobin 11.9 (*)   ? All other components within normal limits  ?POC URINE PREG, ED  ?TROPONIN I (HIGH  SENSITIVITY)  ?TROPONIN I (HIGH SENSITIVITY)  ? ? ? ?EKG ? ?[id number], attending physician, personally viewed and interpreted this EKG ? ?EKG Time: 2125 ?Rate: 76 ?Rhythm: normal sinus rhythm ?Axis: normal ?Intervals: qtc 427 ?QRS: narrow ?ST changes: no st elevation ?Impression: normal ekg ? ? ?RADIOLOGY ?I independently interpreted and visualized the CXR. My interpretation: No pneumonia or pneumothorax ?Radiology interpretation:  ?IMPRESSION:  ?No active cardiopulmonary disease.  ? ? ? ?PROCEDURES: ? ?Critical Care performed: No ? ?Procedures ? ? ?MEDICATIONS ORDERED IN ED: ?Medications - No data to display ? ? ?IMPRESSION / MDM / ASSESSMENT AND PLAN / ED COURSE  ?I reviewed the triage vital signs and the nursing notes. ?             ?               ? ?Differential diagnosis includes, but is not limited to, acs, pna, ptx, esophagitis, panic attack. ? ?Patient presents to the emergency department today because of concern for chest pain.  EKG without any concerning ST elevation.  Chest x-ray without any pneumonia or pneumothorax.  Initial troponin was negative.  Patient got minimal relief from nitroglycerin.  Given that she mentions some hand numbness do wonder if it could be more anxiety/panic attack.  We will try Ativan.  Will check second troponin.  If second troponin is negative patient feels improved and I think would be reasonable for her to be discharged home. ?  ? ? ?FINAL CLINICAL IMPRESSION(S) / ED DIAGNOSES  ? ?Final diagnoses:  ?Nonspecific chest pain  ? ? ?Note:  This document was prepared using Dragon voice recognition software and may include unintentional dictation errors. ? ?  ?Phineas Semen, MD ?06/24/21 0003 ? ?

## 2021-06-23 NOTE — ED Triage Notes (Signed)
Pt presents via POV with complaints of midsternal chest pain starting 30 mins ago. Pt describes the pain as "squeezing" and it causes discomfort when taking deep breaths. Denies taking any medications or change in her normal routine prior to the episode.  ?

## 2021-06-23 NOTE — ED Notes (Signed)
Pt to ED via POV for CP, that started about an hour ago. Pt states she was sitting at her sons baseball game and started having central squeezing CP and SOB. Pt states pain radiates up neck. Denies cardiac hx, and abdominal pain.  ? ?Pt is A&Ox4  ?

## 2021-06-24 ENCOUNTER — Other Ambulatory Visit: Payer: Self-pay

## 2021-06-24 ENCOUNTER — Emergency Department: Payer: Managed Care, Other (non HMO)

## 2021-06-24 ENCOUNTER — Encounter: Payer: Self-pay | Admitting: Radiology

## 2021-06-24 ENCOUNTER — Observation Stay: Payer: Managed Care, Other (non HMO)

## 2021-06-24 DIAGNOSIS — E041 Nontoxic single thyroid nodule: Secondary | ICD-10-CM | POA: Diagnosis not present

## 2021-06-24 DIAGNOSIS — E669 Obesity, unspecified: Secondary | ICD-10-CM | POA: Diagnosis not present

## 2021-06-24 DIAGNOSIS — R079 Chest pain, unspecified: Secondary | ICD-10-CM | POA: Diagnosis not present

## 2021-06-24 DIAGNOSIS — E876 Hypokalemia: Secondary | ICD-10-CM

## 2021-06-24 LAB — NM MYOCAR MULTI W/SPECT W/WALL MOTION / EF
Angina Index: 1
Duke Treadmill Score: 0
Estimated workload: 5.8
Exercise duration (min): 4 min
Exercise duration (sec): 2 s
Peak HR: 146 {beats}/min
Percent HR: 85 %
Rest HR: 99 {beats}/min
ST Depression (mm): 0 mm

## 2021-06-24 LAB — CBC
HCT: 34.9 % — ABNORMAL LOW (ref 36.0–46.0)
Hemoglobin: 11.4 g/dL — ABNORMAL LOW (ref 12.0–15.0)
MCH: 28.6 pg (ref 26.0–34.0)
MCHC: 32.7 g/dL (ref 30.0–36.0)
MCV: 87.7 fL (ref 80.0–100.0)
Platelets: 292 10*3/uL (ref 150–400)
RBC: 3.98 MIL/uL (ref 3.87–5.11)
RDW: 12.9 % (ref 11.5–15.5)
WBC: 11.4 10*3/uL — ABNORMAL HIGH (ref 4.0–10.5)
nRBC: 0 % (ref 0.0–0.2)

## 2021-06-24 LAB — HIV ANTIBODY (ROUTINE TESTING W REFLEX): HIV Screen 4th Generation wRfx: NONREACTIVE

## 2021-06-24 LAB — LIPID PANEL
Cholesterol: 143 mg/dL (ref 0–200)
HDL: 38 mg/dL — ABNORMAL LOW (ref >40)
LDL Cholesterol: 97 mg/dL (ref 0–99)
Total CHOL/HDL Ratio: 3.8 RATIO
Triglycerides: 38 mg/dL (ref <150)
VLDL: 8 mg/dL (ref 0–40)

## 2021-06-24 LAB — BASIC METABOLIC PANEL
Anion gap: 6 (ref 5–15)
BUN: 12 mg/dL (ref 6–20)
CO2: 24 mmol/L (ref 22–32)
Calcium: 8 mg/dL — ABNORMAL LOW (ref 8.9–10.3)
Chloride: 106 mmol/L (ref 98–111)
Creatinine, Ser: 0.74 mg/dL (ref 0.44–1.00)
GFR, Estimated: >60 mL/min (ref >60)
Glucose, Bld: 130 mg/dL — ABNORMAL HIGH (ref 70–99)
Potassium: 3.7 mmol/L (ref 3.5–5.1)
Sodium: 136 mmol/L (ref 135–145)

## 2021-06-24 LAB — C-REACTIVE PROTEIN: CRP: 2.2 mg/dL — ABNORMAL HIGH (ref <1.0)

## 2021-06-24 LAB — PROTIME-INR
INR: 1.1 (ref 0.8–1.2)
Prothrombin Time: 13.9 seconds (ref 11.4–15.2)

## 2021-06-24 LAB — SEDIMENTATION RATE: Sed Rate: 26 mm/hr — ABNORMAL HIGH (ref 0–20)

## 2021-06-24 LAB — MAGNESIUM: Magnesium: 2.1 mg/dL (ref 1.7–2.4)

## 2021-06-24 LAB — TSH: TSH: 1.248 u[IU]/mL (ref 0.350–4.500)

## 2021-06-24 LAB — HEPARIN LEVEL (UNFRACTIONATED): Heparin Unfractionated: <0.1 IU/mL — ABNORMAL LOW (ref 0.30–0.70)

## 2021-06-24 LAB — TROPONIN I (HIGH SENSITIVITY): Troponin I (High Sensitivity): 3 ng/L (ref <18)

## 2021-06-24 LAB — APTT: aPTT: 31 seconds (ref 24–36)

## 2021-06-24 MED ORDER — IOHEXOL 350 MG/ML SOLN
100.0000 mL | Freq: Once | INTRAVENOUS | Status: AC | PRN
  Administered 2021-06-24: 100 mL via INTRAVENOUS

## 2021-06-24 MED ORDER — POTASSIUM CHLORIDE CRYS ER 20 MEQ PO TBCR
40.0000 meq | EXTENDED_RELEASE_TABLET | Freq: Two times a day (BID) | ORAL | Status: AC
  Administered 2021-06-24 (×2): 40 meq via ORAL
  Filled 2021-06-24 (×2): qty 2

## 2021-06-24 MED ORDER — PANTOPRAZOLE SODIUM 40 MG PO TBEC
40.0000 mg | DELAYED_RELEASE_TABLET | Freq: Every day | ORAL | Status: DC
  Administered 2021-06-24: 40 mg via ORAL
  Filled 2021-06-24: qty 1

## 2021-06-24 MED ORDER — ASPIRIN EC 81 MG PO TBEC
81.0000 mg | DELAYED_RELEASE_TABLET | Freq: Every day | ORAL | Status: DC
  Administered 2021-06-24: 81 mg via ORAL
  Filled 2021-06-24: qty 1

## 2021-06-24 MED ORDER — IBUPROFEN 800 MG PO TABS
800.0000 mg | ORAL_TABLET | Freq: Three times a day (TID) | ORAL | Status: DC
  Administered 2021-06-24: 800 mg via ORAL
  Filled 2021-06-24: qty 1

## 2021-06-24 MED ORDER — HEPARIN (PORCINE) 25000 UT/250ML-% IV SOLN
1300.0000 [IU]/h | INTRAVENOUS | Status: DC
  Administered 2021-06-24: 1050 [IU]/h via INTRAVENOUS
  Filled 2021-06-24: qty 250

## 2021-06-24 MED ORDER — IBUPROFEN 800 MG PO TABS
800.0000 mg | ORAL_TABLET | Freq: Three times a day (TID) | ORAL | 0 refills | Status: AC
Start: 2021-06-24 — End: 2021-07-08

## 2021-06-24 MED ORDER — PANTOPRAZOLE SODIUM 40 MG PO TBEC: 40.0000 mg | DELAYED_RELEASE_TABLET | Freq: Every day | ORAL | 0 refills | Status: DC

## 2021-06-24 MED ORDER — COLCHICINE 0.6 MG PO TABS: 0.6000 mg | ORAL_TABLET | Freq: Two times a day (BID) | ORAL | 1 refills | Status: DC

## 2021-06-24 MED ORDER — HEPARIN BOLUS VIA INFUSION
2400.0000 [IU] | Freq: Once | INTRAVENOUS | Status: AC
  Administered 2021-06-24: 2400 [IU] via INTRAVENOUS
  Filled 2021-06-24: qty 2400

## 2021-06-24 MED ORDER — COLCHICINE 0.6 MG PO TABS
0.6000 mg | ORAL_TABLET | Freq: Two times a day (BID) | ORAL | Status: DC
  Administered 2021-06-24: 0.6 mg via ORAL
  Filled 2021-06-24 (×2): qty 1

## 2021-06-24 MED ORDER — TECHNETIUM TC 99M TETROFOSMIN IV KIT
30.0000 | PACK | Freq: Once | INTRAVENOUS | Status: AC
  Administered 2021-06-24: 31.25 via INTRAVENOUS
  Filled 2021-06-24: qty 30

## 2021-06-24 MED ORDER — TECHNETIUM TC 99M TETROFOSMIN IV KIT
10.0000 | PACK | Freq: Once | INTRAVENOUS | Status: AC | PRN
  Administered 2021-06-24: 10.42 via INTRAVENOUS
  Filled 2021-06-24: qty 10

## 2021-06-24 MED ORDER — HEPARIN BOLUS VIA INFUSION
4000.0000 [IU] | Freq: Once | INTRAVENOUS | Status: AC
  Administered 2021-06-24: 4000 [IU] via INTRAVENOUS
  Filled 2021-06-24: qty 4000

## 2021-06-24 NOTE — ED Notes (Signed)
Assumed care of patient.  Patient resting quietly in bed with eyes closed, appears sleeping. Resp even, unlabored on RA. ST at 103 on monitor. Heparin gtt infusing at 1050 units/hr. No distress noted at this time.  ?

## 2021-06-24 NOTE — ED Notes (Signed)
Informed RN bed assigned 

## 2021-06-24 NOTE — Progress Notes (Signed)
ANTICOAGULATION CONSULT NOTE ? ?Pharmacy Consult for heparin infusion ?Indication: ACS/STEMI ? ?Allergies  ?Allergen Reactions  ? Sulfa Antibiotics Hives  ? ? ?Patient Measurements: ?Height: 5\' 4"  (162.6 cm) ?Weight: 104.3 kg (230 lb) ?IBW/kg (Calculated) : 54.7 ?Heparin Dosing Weight: 79.2 kg ? ?Vital Signs: ?BP: 126/87 (04/06 0900) ?Pulse Rate: 93 (04/06 0900) ? ?Labs: ?Recent Labs  ?  06/23/21 ?2142 06/23/21 ?2346 06/24/21 ?08/24/21 06/24/21 ?08/24/21  ?HGB 11.9*  --   --  11.4*  ?HCT 36.4  --   --  34.9*  ?PLT 299  --   --  292  ?APTT  --   --  31  --   ?LABPROT  --   --  13.9  --   ?INR  --   --  1.1  --   ?CREATININE 0.96  --   --  0.74  ?TROPONINIHS 4 3  --   --   ? ? ? ?Estimated Creatinine Clearance: 100 mL/min (by C-G formula based on SCr of 0.74 mg/dL). ? ? ?Medical History: ?Past Medical History:  ?Diagnosis Date  ? Bursitis   ? Ruptured disk   ? ?Assessment: ?Pt is 51 yo female presenting to ED w/ chest pain & pressure found with changes b/w initial EKG and repeat EKG. Pharmacy has been consulted for heparin dosing for ACS.  ? ?Baseline labs: Hgb 11.9, plts 299, aPTT 31, INR 1.1  ? ?Date Time HL Rate/comment ?4/6 0929 <0.01 Undetectable, 1050 un/hr ? ?Goal of Therapy:  ?Heparin level 0.3-0.7 units/ml ?Monitor platelets by anticoagulation protocol: Yes ?  ?Plan:  ?Heparin level undetectable  ?Bolus 2400 units x 1 and increase heparin infusion to 1300 units/hr ?Check HL in 6 hrs after rate change ?CBC daily while on heparin. ? ?6/6 PharmD ?06/24/2021 ?11:23 AM ? ? ? ?

## 2021-06-24 NOTE — ED Notes (Signed)
Patient resting quietly in bed with eyes closed. Appears to be sleeping. Resp even, unlabored on RA. No distress noted. ?

## 2021-06-24 NOTE — ED Notes (Signed)
Pt gave verbal consent to discharge. Pt mother driving pt home.  ?

## 2021-06-24 NOTE — Discharge Summary (Signed)
?Physician Discharge Summary ?  ?Patient: Terri Saunders MRN: [id number] DOB: 25-Oct-1970  ?Admit date:     06/23/2021  ?Discharge date: 06/24/21  ?Discharge Physician: Edwin Dada  ? ?PCP: [place]  ? ?Recommendations at discharge:  ?Follow up Cardiology for pericarditis in 2-4 weeks ? ? ? ? ?Discharge Diagnoses: ?Principal Problem: ?  Pericarditis ?Active Problems: ?  Obesity (BMI 30-39.9) ?  Thyroid nodule ?  Hypokalemia ?  ? ? ? ?Hospital Course: ?The patient is a 51 y.o. F with history thyroid nodule who presented with chest pain. ? ?Was watching a baseball game when she had onset of central chest pain, worse with inspiration.   ? ?In the ER, she had diffuse nonspecific TW changes on ECG but normal troponins.  Was started on heparin and admitted for observation. ? ?CTA chest ruled out PE, dissection, pneumonia.  Did show small pericardial effusion and known (benign) thyroid nodule. ? ?Cardiology were consulted who recommended nuclear stress test that showed normal EF, no reversible ischemia.   ? ?Given the positional (worse with laying flat) and pleuritic nature of her pain, and the pericardial effusion on imaging, and elevated ESR, CRP, she was diagnosed with pericarditis.  She tolerated diet well and ambulated without exertional symptoms. Discharged with ibuprofen for 2 weeks and colchicine for 2 months and Cardiology follow up. ? ? ? ? ? ? ? ? ?  ? ?Pain control - [employer] Controlled Substance Reporting System database was reviewed.  ? ? ? ?Consultants: Cardiology ?Procedures performed: CTA chest, Nuclear medicine Cardiac stress  ?Disposition: Home ? ?DISCHARGE MEDICATION: ?Allergies as of 06/24/2021   ? ?   Reactions  ? Sulfa Antibiotics Hives  ? ?  ? ?  ?Medication List  ?  ? ?STOP taking these medications   ? ?gabapentin 300 MG capsule ?Commonly known as: NEURONTIN ?  ?indomethacin 25 MG capsule ?Commonly known as: INDOCIN ?  ?metroNIDAZOLE 0.75 % vaginal gel ?Commonly known as:  METROGEL VAGINAL ?  ?Probiotic Daily Caps ?  ?terconazole 0.4 % vaginal cream ?Commonly known as: TERAZOL 7 ?  ? ?  ? ?TAKE these medications   ? ?colchicine 0.6 MG tablet ?Take 1 tablet (0.6 mg total) by mouth 2 (two) times daily. ?  ?ibuprofen 800 MG tablet ?Commonly known as: ADVIL ?Take 1 tablet (800 mg total) by mouth 3 (three) times daily for 14 days. ?  ?pantoprazole 40 MG tablet ?Commonly known as: PROTONIX ?Take 1 tablet (40 mg total) by mouth daily. ?Start taking on: June 25, 2021 ?  ? ?  ? ? Follow-up Information   ? ? Corey Skains, MD. Schedule an appointment as soon as possible for a visit in 2 week(s).   ?Specialty: Cardiology ?Contact information: ?[address] ?[facility] [place] ?[place] [address] ?[phone] ? ? ?  ?  ? ?  ?  ? ?  ? ?Discharge Instructions   ? ? Discharge instructions   Complete by: As directed ?  ? From Dr. Loleta Books: ?You were admitted for chest pain. ?Here, we performed an ECG that was abnormal but not specific (lots of things can cause this). ?We checked heart enzymes, which were normal, and we did a CAT scan of your chest with angiography (to visualize the blood vessels) and this was reassuring that there was no pneumonia, blood clots, masses, or rupture of the heart, blood vessels or esophagus (food tube). ? ?Instead, this CAT scan showed that you had some  slightly fluid around the heart, and combined with your normal stress test (that rules out a heart attack) and hte fact that your pain is worse with taking a deep breath or laying back, we suspect you have pericarditis. ? ?Pericarditis is common, usually unexplained. ?It resolves with anti-inflammatories. ? ?Take ibuprofen 800 mg three times daily for 2 weeks ?Take pantoprazole/Protonix once daily for that two weeks ? ?Take the second anti-inflammatory colchicine 0.6 mg twice daily for 2 months ?Call Dr. Nehemiah Massed the heart doctor for a follow up appointment as they recommend (in a few weeks)  ?  Increase activity slowly   Complete by: As directed ?  ? ?  ? ? ?Discharge Exam: ?Filed Weights  ? 06/23/21 2127  ?Weight: 104.3 kg  ? ?General: Pt is alert, awake, not in acute distress ?Cardiovascular: RRR, nl S1-S2, no murmurs appreciated.   No LE edema.   ?Respiratory: Normal respiratory rate and rhythm.  CTAB without rales or wheezes. ?Abdominal: Abdomen soft and non-tender.  No distension or HSM.   ?Neuro/Psych: Strength symmetric in upper and lower extremities.  Judgment and insight appear normal. ? ? ?Condition at discharge: good ? ?The results of significant diagnostics from this hospitalization (including imaging, microbiology, ancillary and laboratory) are listed below for reference.  ? ?Imaging Studies: ?DG Chest 2 View ? ?Result Date: 06/23/2021 ?CLINICAL DATA:  Chest pain EXAM: CHEST - 2 VIEW COMPARISON:  None. FINDINGS: The heart size and mediastinal contours are within normal limits. Both lungs are clear. The visualized skeletal structures are unremarkable. IMPRESSION: No active cardiopulmonary disease. Electronically Signed   By: Lucienne Capers M.D.   On: 06/23/2021 22:15  ? ?CT Angio Chest PE W and/or Wo Contrast ? ?Result Date: 06/24/2021 ?CLINICAL DATA:  Midsternal chest pain. EXAM: CT ANGIOGRAPHY CHEST WITH CONTRAST TECHNIQUE: Multidetector CT imaging of the chest was performed using the standard protocol during bolus administration of intravenous contrast. Multiplanar CT image reconstructions and MIPs were obtained to evaluate the vascular anatomy. RADIATION DOSE REDUCTION: This exam was performed according to the departmental dose-optimization program which includes automated exposure control, adjustment of the mA and/or kV according to patient size and/or use of iterative reconstruction technique. CONTRAST:  130mL OMNIPAQUE IOHEXOL 350 MG/ML SOLN COMPARISON:  PA Lat chest yesterday is the only chest prior. FINDINGS: Cardiovascular: Satisfactory opacification of the pulmonary arteries to the  segmental level. No evidence of pulmonary embolism. There is mild cardiomegaly and small pericardial effusion. There is no visible coronary artery or aortic atherosclerosis no aortic aneurysm or dissection. The great vessels opacify normally. Pulmonary veins are decompressed. Mediastinum/Nodes: There is a 2.3 cm heterogeneous nodule at the junction of the right lobe and isthmus of the thyroid gland. Thyroid ultrasound follow-up is recommended. There is no intrathoracic or axillary adenopathy, tracheal filling defect or esophageal thickening. Lungs/Pleura: There is posterior atelectasis in the lower lung fields. No focal pneumonia is seen or appreciable pulmonary nodules. There is no pleural effusion, thickening or pneumothorax. There is mild elevation of the right hemidiaphragm. Upper Abdomen: The liver is hypodense consistent with a least mild steatosis. No acute abnormality is seen. Musculoskeletal: No abnormality of the visualized chest wall is seen. No acute or significant osseous findings. Review of the MIP images confirms the above findings. IMPRESSION: 1. No evidence of arterial dilatation or embolism is seen. 2. Mild cardiomegaly with a small pericardial effusion. No findings of acute CHF. 3. Posterior atelectasis in the lower lung fields without evidence of focal pneumonia. 4. 2.3 cm  heterogeneous nodule, junction of the right lobe and isthmus of thyroid. Ultrasound follow-up recommended. Reference: J Am Coll Radiol. 2015 Feb;12(2): 143-50 Electronically Signed   By: Telford Nab M.D.   On: 06/24/2021 01:43  ? ?NM Myocar Multi W/Spect W/Wall Motion / EF ? ?Result Date: 06/24/2021 ?  The study is normal. The study is low risk.   No ST deviation was noted.   LV perfusion is normal.   Left ventricular function is normal. The left ventricular ejection fraction is normal (55-65%). End diastolic cavity size is normal.   Prior study not available for comparison.  ? ?US THYROID ? ?Result Date: 06/24/2021 ?CLINICAL  DATA:  Incidental on CT. EXAM: THYROID ULTRASOUND TECHNIQUE: Ultrasound examination of the thyroid gland and adjacent soft tissues was performed. COMPARISON:  11/14/2012, 12/11/2012 FINDINGS: Parenchymal Ec

## 2021-06-24 NOTE — Progress Notes (Addendum)
Patient is off floor in Nuclear medicine, unable to change heparin rate and administer bolus ordered at this time.  ?

## 2021-06-24 NOTE — Progress Notes (Signed)
Admission profile updated. ?

## 2021-06-24 NOTE — Progress Notes (Signed)
ANTICOAGULATION CONSULT NOTE ? ?Pharmacy Consult for heparin infusion ?Indication: ACS/STEMI ? ?Allergies  ?Allergen Reactions  ? Sulfa Antibiotics Hives  ? ? ?Patient Measurements: ?Height: 5\' 4"  (162.6 cm) ?Weight: 104.3 kg (230 lb) ?IBW/kg (Calculated) : 54.7 ?Heparin Dosing Weight: 79.2 kg ? ?Vital Signs: ?Temp: 98.4 ?F (36.9 ?C) (04/05 2127) ?Temp Source: Oral (04/05 2127) ?BP: 119/83 (04/06 0030) ?Pulse Rate: 98 (04/06 0030) ? ?Labs: ?Recent Labs  ?  06/23/21 ?2142 06/23/21 ?2346  ?HGB 11.9*  --   ?HCT 36.4  --   ?PLT 299  --   ?CREATININE 0.96  --   ?TROPONINIHS 4 3  ? ? ?Estimated Creatinine Clearance: 83.4 mL/min (by C-G formula based on SCr of 0.96 mg/dL). ? ? ?Medical History: ?Past Medical History:  ?Diagnosis Date  ? Bursitis   ? Ruptured disk   ? ?Assessment: ?Pt is 51 yo female presenting to ED w/ chest pain & pressure found with changes b/w initial EKG and repeat EKG. ? ?Goal of Therapy:  ?Heparin level 0.3-0.7 units/ml ?Monitor platelets by anticoagulation protocol: Yes ?  ?Plan:  ?Bolus 4000 units x 1 ?Start heparin infusion at 1050 units/hr ?Check HL in 6 hrs after start of infusion ?CBC daily while on heparin. ? ?54, PharmD, MBA ?06/24/2021 ?2:06 AM ? ? ? ?

## 2021-06-24 NOTE — H&P (Signed)
?History and Physical ? ?Terri Saunders WUJ:[id number] DOB: 10/07/1970 DOA: 06/23/2021 ? ?Referring physician: Dr. Don PerkingVeronese, EDP  ?PCP: [facility]  ?Outpatient Specialists: Gynecology. ?Patient coming from: Home ? ?Chief Complaint: Atypical chest pain ? ?HPI: Terri Quantina C Thau is a 51 y.o. female with medical history significant for obesity presenting to [facility] ED due to sudden onset chest pain while watching her son play baseball hours ago, they won the game.  Endorses, heaviness in the center of her chest.  Radiating to her neck.  No prior history of chest pain or pulmonary embolism or DVT.  No history of tobacco or alcohol use.  No history of sedentary lifestyle.  Upon presentation to the ED patient is found to be tachycardic.  Work-up revealed negative CT angio chest to rule out pulmonary embolism but with incidental finding of thyroid nodule.  Troponin negative x2, abnormal twelve-lead EKG.  Due to persistent chest pain with abnormal 2D EKG, EDP started the patient on heparin drip.  Cardiology consulted.  Patient admitted to hospitalist service, [facility], for chest pain rule out ACS. ? ?ED Course: Temperature 98.4.  BP 136/88, heart rate 101, respiratory 18, saturation 98% on room air.  Labs today significant for serum potassium 3.3, hemoglobin 11.9. ?Review of Systems: ?Review of systems as noted in the HPI. All other systems reviewed and are negative. ? ? ?Past Medical History:  ?Diagnosis Date  ? Bursitis   ? Ruptured disk   ? ?Past Surgical History:  ?Procedure Laterality Date  ? DILATION AND CURETTAGE OF UTERUS  2003  ? following a miscarriage  ? LAPAROSCOPY  2004  ? diagnostic for infertility work up  ? ? ?Social History:  reports that she has quit smoking. She has never used smokeless tobacco. She reports that she does not drink alcohol and does not use drugs. ? ? ?Allergies  ?Allergen Reactions  ? Sulfa Antibiotics Hives  ? ? ?Family History  ?Problem Relation Age of Onset  ? Hyperlipidemia Mother   ?  Irritable bowel syndrome Brother   ? Colon cancer Maternal Grandmother   ? Heart disease Maternal Grandfather   ? Colon cancer Maternal Grandfather   ? Coronary artery disease Paternal Grandmother   ? Hypertension Paternal Grandmother   ? Heart attack Paternal Grandfather   ?  ? ? ?Prior to Admission medications   ?Medication Sig Start Date End Date Taking? Authorizing Provider  ?gabapentin (NEURONTIN) 300 MG capsule TAKE 1 CAPSULE BY MOUTH ONCE DAILY IN THE MORNING AND 2 CAPSULES ONCE DAILY AT NIGHT 01/05/18   [provider]  ?indomethacin (INDOCIN) 25 MG capsule indomethacin 25 mg capsule ? TAKE 1 CAPSULE BY MOUTH TWICE DAILY FOR 30 DAYS    [provider]  ?metroNIDAZOLE (METROGEL VAGINAL) 0.75 % vaginal gel Place 1 Applicatorful vaginally 2 (two) times daily. 02/12/19   Chrismon, Jodell Ciproennis E, PA-C  ?Probiotic Product (PROBIOTIC DAILY) CAPS Take 1 capsule by mouth daily. 03/29/18   Purcell NailsShambley, Melody N, CNM  ?terconazole (TERAZOL 7) 0.4 % vaginal cream Place 1 applicator vaginally at bedtime. ?Patient not taking: Reported on 04/04/2018 03/29/18   Purcell NailsShambley, Melody N, CNM  ? ? ?Physical Exam: ?BP 119/83   Pulse 98   Temp 98.4 ?F (36.9 ?C) (Oral)   Resp 19   Ht 5\' 4"  (1.626 m)   Wt 104.3 kg   LMP 06/17/2021   SpO2 97%   BMI 39.48 kg/m?  ? ?General: 51 y.o. year-old female well developed well nourished in no acute  distress.  Alert and oriented x3. ?Cardiovascular: Tachycardic with no rubs or gallops. No JVD noted.  No lower extremity edema. 2/4 pulses in all 4 extremities. ?Respiratory: Clear to auscultation with no wheezes or rales. Good inspiratory effort. ?Abdomen: Soft nontender nondistended with normal bowel sounds x4 quadrants. ?Muskuloskeletal: No cyanosis, clubbing or edema noted bilaterally ?Neuro: CN II-XII intact, strength, sensation, reflexes ?Skin: No ulcerative lesions noted or rashes ?Psychiatry: Judgement and insight appear normal. Mood is appropriate for condition and setting ?   ?    ?   ?Labs on Admission:  ?Basic Metabolic Panel: ?Recent Labs  ?Lab 06/23/21 ?2142  ?NA 137  ?K 3.3*  ?CL 105  ?CO2 23  ?GLUCOSE 95  ?BUN 15  ?CREATININE 0.96  ?CALCIUM 8.5*  ? ?Liver Function Tests: ?No results for input(s): AST, ALT, ALKPHOS, BILITOT, PROT, ALBUMIN in the last 168 hours. ?No results for input(s): LIPASE, AMYLASE in the last 168 hours. ?No results for input(s): AMMONIA in the last 168 hours. ?CBC: ?Recent Labs  ?Lab 06/23/21 ?2142  ?WBC 8.6  ?HGB 11.9*  ?HCT 36.4  ?MCV 87.9  ?PLT 299  ? ?Cardiac Enzymes: ?No results for input(s): CKTOTAL, CKMB, CKMBINDEX, TROPONINI in the last 168 hours. ? ?BNP (last 3 results) ?No results for input(s): BNP in the last 8760 hours. ? ?ProBNP (last 3 results) ?No results for input(s): PROBNP in the last 8760 hours. ? ?CBG: ?No results for input(s): GLUCAP in the last 168 hours. ? ?Radiological Exams on Admission: ?DG Chest 2 View ? ?Result Date: 06/23/2021 ?CLINICAL DATA:  Chest pain EXAM: CHEST - 2 VIEW COMPARISON:  None. FINDINGS: The heart size and mediastinal contours are within normal limits. Both lungs are clear. The visualized skeletal structures are unremarkable. IMPRESSION: No active cardiopulmonary disease. Electronically Signed   By: Burman Nieves M.D.   On: 06/23/2021 22:15  ? ?CT Angio Chest PE W and/or Wo Contrast ? ?Result Date: 06/24/2021 ?CLINICAL DATA:  Midsternal chest pain. EXAM: CT ANGIOGRAPHY CHEST WITH CONTRAST TECHNIQUE: Multidetector CT imaging of the chest was performed using the standard protocol during bolus administration of intravenous contrast. Multiplanar CT image reconstructions and MIPs were obtained to evaluate the vascular anatomy. RADIATION DOSE REDUCTION: This exam was performed according to the departmental dose-optimization program which includes automated exposure control, adjustment of the mA and/or kV according to patient size and/or use of iterative reconstruction technique. CONTRAST:  OMNIPAQUE IOHEXOL 350 MG/ML SOLN  COMPARISON:  PA Lat chest yesterday is the only chest prior. FINDINGS: Cardiovascular: Satisfactory opacification of the pulmonary arteries to the segmental level. No evidence of pulmonary embolism. There is mild cardiomegaly and small pericardial effusion. There is no visible coronary artery or aortic atherosclerosis no aortic aneurysm or dissection. The great vessels opacify normally. Pulmonary veins are decompressed. Mediastinum/Nodes: There is a 2.3 cm heterogeneous nodule at the junction of the right lobe and isthmus of the thyroid gland. Thyroid ultrasound follow-up is recommended. There is no intrathoracic or axillary adenopathy, tracheal filling defect or esophageal thickening. Lungs/Pleura: There is posterior atelectasis in the lower lung fields. No focal pneumonia is seen or appreciable pulmonary nodules. There is no pleural effusion, thickening or pneumothorax. There is mild elevation of the right hemidiaphragm. Upper Abdomen: The liver is hypodense consistent with a least mild steatosis. No acute abnormality is seen. Musculoskeletal: No abnormality of the visualized chest wall is seen. No acute or significant osseous findings. Review of the MIP images confirms the above findings. IMPRESSION: 1. No evidence of  arterial dilatation or embolism is seen. 2. Mild cardiomegaly with a small pericardial effusion. No findings of acute CHF. 3. Posterior atelectasis in the lower lung fields without evidence of focal pneumonia. 4. 2.3 cm heterogeneous nodule, junction of the right lobe and isthmus of thyroid. Ultrasound follow-up recommended. Reference: J Am Coll Radiol. 2015 Feb;12(2): 143-50 Electronically Signed   By: Almira Bar M.D.   On: 06/24/2021 01:43   ? ?EKG: I independently viewed the EKG done and my findings are as followed:  Sinus tachycardia.  Borderline T abnormalities, diffuse leads. ? ?Assessment/Plan ?Present on Admission: ? Chest pain ? ?Principal Problem: ?  Chest pain ? ?Atypical chest pain,  rule out ACS ?Chest pain while at rest lasting more than 5 hours ?Heaviness in the center of her chest.  Radiating to her neck. ?Troponin negative x2. ?Abnormal twelve-lead EKG with ST-T changes. ?Received fu

## 2021-06-24 NOTE — ED Provider Notes (Signed)
Accepted care of this patient from Dr. Archie Balboa at midnight. ? ?Patient was reassessed by me.  She tells me that she was watching her son's game earlier this evening when she started having chest pain that she describes as pressure that is worse when she tries to take a deep breath.  The pain is markedly improved but is still present and has been constant since its onset.  She denies ever having similar pain.  She denies any personal history of heart disease or smoking.  Grandfather has had heart attacks.  She denies any history of PE or DVT, recent travel immobilization, leg pain or swelling, hemoptysis, or exogenous hormones.  She denies any cough or congestion. ? ? ?On exam she is well-appearing in no distress with normal vital signs, strong pulses in all 4 extremities, heart regular rate and rhythm, lungs are clear to auscultation, no asymmetric leg swelling or pitting edema. ? ? ?Initial EKG was normal with no acute abnormalities.  Second troponin was pending and that was also negative.  Since patient had persistent pain I sent her for CTA of the chest which does not show any signs of PE or dissection but did find a very small/trace pericardial effusion and mild cardiomegaly.  Repeat EKG showed T wave inversions in inferior lateral leads which was not present on her initial EKG. Patient continues to complain of 4/10 chest heaviness. Therefore with EKG changes and persistent pain, hospitalist was consulted and after discussion has accepted patient to their service.  I will start patient on heparin drip. ? ? ? ?CRITICAL CARE ?Performed by: Rudene Re ? ?? ? ?Total critical care time: 30 min ? ?Critical care time was exclusive of separately billable procedures and treating other patients. ? ?Critical care was necessary to treat or prevent imminent or life-threatening deterioration. ? ?Critical care was time spent personally by me on the following activities: development of treatment plan with patient and/or  surrogate as well as nursing, discussions with consultants, evaluation of patient's response to treatment, examination of patient, obtaining history from patient or surrogate, ordering and performing treatments and interventions, ordering and review of laboratory studies, ordering and review of radiographic studies, pulse oximetry and re-evaluation of patient's condition. ? ? ?I, Rudene Re, attending MD, have personally viewed and interpreted the images obtained during this visit as below: ? ?CTA negative for PE or dissection ? ? ?___________________________________________________ ?Interpretation by Radiologist:  ?DG Chest 2 View ? ?Result Date: 06/23/2021 ?CLINICAL DATA:  Chest pain EXAM: CHEST - 2 VIEW COMPARISON:  None. FINDINGS: The heart size and mediastinal contours are within normal limits. Both lungs are clear. The visualized skeletal structures are unremarkable. IMPRESSION: No active cardiopulmonary disease. Electronically Signed   By: Lucienne Capers M.D.   On: 06/23/2021 22:15  ? ?CT Angio Chest PE W and/or Wo Contrast ? ?Result Date: 06/24/2021 ?CLINICAL DATA:  Midsternal chest pain. EXAM: CT ANGIOGRAPHY CHEST WITH CONTRAST TECHNIQUE: Multidetector CT imaging of the chest was performed using the standard protocol during bolus administration of intravenous contrast. Multiplanar CT image reconstructions and MIPs were obtained to evaluate the vascular anatomy. RADIATION DOSE REDUCTION: This exam was performed according to the departmental dose-optimization program which includes automated exposure control, adjustment of the mA and/or kV according to patient size and/or use of iterative reconstruction technique. CONTRAST:  115mL OMNIPAQUE IOHEXOL 350 MG/ML SOLN COMPARISON:  PA Lat chest yesterday is the only chest prior. FINDINGS: Cardiovascular: Satisfactory opacification of the pulmonary arteries to the segmental level. No evidence  of pulmonary embolism. There is mild cardiomegaly and small  pericardial effusion. There is no visible coronary artery or aortic atherosclerosis no aortic aneurysm or dissection. The great vessels opacify normally. Pulmonary veins are decompressed. Mediastinum/Nodes: There is a 2.3 cm heterogeneous nodule at the junction of the right lobe and isthmus of the thyroid gland. Thyroid ultrasound follow-up is recommended. There is no intrathoracic or axillary adenopathy, tracheal filling defect or esophageal thickening. Lungs/Pleura: There is posterior atelectasis in the lower lung fields. No focal pneumonia is seen or appreciable pulmonary nodules. There is no pleural effusion, thickening or pneumothorax. There is mild elevation of the right hemidiaphragm. Upper Abdomen: The liver is hypodense consistent with a least mild steatosis. No acute abnormality is seen. Musculoskeletal: No abnormality of the visualized chest wall is seen. No acute or significant osseous findings. Review of the MIP images confirms the above findings. IMPRESSION: 1. No evidence of arterial dilatation or embolism is seen. 2. Mild cardiomegaly with a small pericardial effusion. No findings of acute CHF. 3. Posterior atelectasis in the lower lung fields without evidence of focal pneumonia. 4. 2.3 cm heterogeneous nodule, junction of the right lobe and isthmus of thyroid. Ultrasound follow-up recommended. Reference: J Am Coll Radiol. 2015 Feb;12(2): 143-50 Electronically Signed   By: Telford Nab M.D.   On: 06/24/2021 01:43   ? ? ?  ?Rudene Re, MD ?06/24/21 0159 ? ?

## 2021-06-24 NOTE — Consult Note (Addendum)
?[facility] C[facility]IOLOG[facility] CONSULT NOTE  ? ?    ?Patient ID: ?Terri Saunders ?MRN: [id number] ?DOB/AGE: 08-12-1970 51 y.o. ? ?Admit date: 06/23/2021 ?Referring Physician Dr. Margo Aye ?Primary Physician [facility] ?Primary Cardiologist none ?Reason for Consultation chest pain ? ?HPI: The patient is a 51 year old female with a past medical history of obesity who presents to [facility] ED 06/23/2021 with chest pain. Cardiology is consulted for further assistance.  ? ?The patient states she was sitting at her son's sporting game yesterday afternoon when she suddenly experienced tightness in her chest that was worse when she breathes in and out.  She had associated upper shoulder, back, and neck pain with mild tingling in her hands. She denies associated diaphoresis and the pain was non exertional in nature and was relieved with SL nitroglycerin and ativan. This has never happened to her in the past. She still has some chest discomfort during interview this morning, worse with deep inspiration. She reports an uneventful week prior to yesterday, no recent illness or significant events.  ? ?I reexamined the patient after she had her nuclear medicine images and stress test and she reports the pain was much worse with lying flat in the scanner and her pain was relieved eventually after she sat forward. Still very pleuritic in nature. ? ?She is a former on and off 0.5PPD smoker for 15 years, quit 2006. Drinks 1 glass of wine per week. Family history of heart attack in her paternal grandfather and angina in there paternal grandmother. Works for [employer].  ? ?Her initial EKG shows sinus rhythm with rate 76, and repeat 3 hours later shows sinus tach with mild T wave flattening laterally and inversions in leads II and III. Troponins negative at 3>>4.  ? ?Treadmill stress test showing normal myocardial perfusion and normal LV systolic function without evidence of myocardial ischemia.  Patient had no evidence of EKG  changes ? ?Review of systems complete and found to be negative unless listed above  ? ? ? ?Past Medical History:  ?Diagnosis Date  ? Bursitis   ? Ruptured disk   ?  ?Past Surgical History:  ?Procedure Laterality Date  ? DILATION AND CURETTAGE OF UTERUS  2003  ? following a miscarriage  ? LAPAROSCOPY  2004  ? diagnostic for infertility work up  ?  ?(Not in a hospital admission) ? ?Social History  ? ?Socioeconomic History  ? Marital status: Married  ?  Spouse name: Not on file  ? Number of children: Not on file  ? Years of education: Not on file  ? Highest education level: Not on file  ?Occupational History  ? Not on file  ?Tobacco Use  ? Smoking status: Former  ? Smokeless tobacco: Never  ?Vaping Use  ? Vaping Use: Never used  ?Substance and Sexual Activity  ? Alcohol use: No  ?  Alcohol/week: 0.0 standard drinks  ? Drug use: No  ? Sexual activity: Yes  ?  Birth control/protection: None  ?Other Topics Concern  ? Not on file  ?Social History Narrative  ? Not on file  ? ?Social Determinants of Health  ? ?Financial Resource Strain: Not on file  ?Food Insecurity: Not on file  ?Transportation Needs: Not on file  ?Physical Activity: Not on file  ?Stress: Not on file  ?Social Connections: Not on file  ?Intimate Partner Violence: Not on file  ?  ?Family History  ?Problem Relation Age of Onset  ? Hyperlipidemia Mother   ? Irritable bowel syndrome Brother   ?  Colon cancer Maternal Grandmother   ? Heart disease Maternal Grandfather   ? Colon cancer Maternal Grandfather   ? Coronary artery disease Paternal Grandmother   ? Hypertension Paternal Grandmother   ? Heart attack Paternal Grandfather   ?  ? ? ?Review of systems complete and found to be negative unless listed above  ? ? ?PHYSICAL EXAM ?General: Pleasant middle aged caucasian female , well nourished, in no acute distress. Laying at incline in ED stretcher with her father at bedside.  ?HEENT:  Normocephalic and atraumatic. ?Neck:  No JVD.  ?Lungs: Normal respiratory effort  on room air. Clear bilaterally to auscultation. No wheezes, crackles, rhonchi.  ?Heart: HRRR . Normal S1 and S2 without rub, gallops or murmurs. Radial & DP pulses 2+ bilaterally. ?Abdomen: Non-distended appearing.  ?Msk: Normal strength and tone for age. ?Extremities: Warm and well perfused. No clubbing, cyanosis. No peripheral edema.  ?Neuro: Alert and oriented X 3. ?Psych:  Answers questions appropriately.  ? ?Labs: ?  ?Lab Results  ?Component Value Date  ? WBC 11.4 (H) 06/24/2021  ? HGB 11.4 (L) 06/24/2021  ? HCT 34.9 (L) 06/24/2021  ? MCV 87.7 06/24/2021  ? PLT 292 06/24/2021  ?  ?Recent Labs  ?Lab 06/24/21 ?9622  ?NA 136  ?K 3.7  ?CL 106  ?CO2 24  ?BUN 12  ?CREATININE 0.74  ?CALCIUM 8.0*  ?GLUCOSE 130*  ? ?No results found for: CKTOTAL, CKMB, CKMBINDEX, TROPONINI  ?Lab Results  ?Component Value Date  ? CHOL 143 06/24/2021  ? CHOL 152 06/09/2017  ? CHOL 141 11/09/2016  ? ?Lab Results  ?Component Value Date  ? HDL 38 (L) 06/24/2021  ? HDL 48 06/09/2017  ? HDL 45 11/09/2016  ? ?Lab Results  ?Component Value Date  ? LDLCALC 97 06/24/2021  ? LDLCALC 92 06/09/2017  ? LDLCALC 84 11/09/2016  ? ?Lab Results  ?Component Value Date  ? TRIG 38 06/24/2021  ? TRIG 60 06/09/2017  ? TRIG 61 11/09/2016  ? ?Lab Results  ?Component Value Date  ? CHOLHDL 3.8 06/24/2021  ? CHOLHDL 3.2 06/09/2017  ? ?No results found for: LDLDIRECT  ?  ?Radiology: DG Chest 2 View ? ?Result Date: 06/23/2021 ?CLINICAL DATA:  Chest pain EXAM: CHEST - 2 VIEW COMPARISON:  None. FINDINGS: The heart size and mediastinal contours are within normal limits. Both lungs are clear. The visualized skeletal structures are unremarkable. IMPRESSION: No active cardiopulmonary disease. Electronically Signed   By: Burman Nieves M.D.   On: 06/23/2021 22:15  ? ?CT Angio Chest PE W and/or Wo Contrast ? ?Result Date: 06/24/2021 ?CLINICAL DATA:  Midsternal chest pain. EXAM: CT ANGIOGRAPHY CHEST WITH CONTRAST TECHNIQUE: Multidetector CT imaging of the chest was performed  using the standard protocol during bolus administration of intravenous contrast. Multiplanar CT image reconstructions and MIPs were obtained to evaluate the vascular anatomy. RADIATION DOSE REDUCTION: This exam was performed according to the departmental dose-optimization program which includes automated exposure control, adjustment of the mA and/or kV according to patient size and/or use of iterative reconstruction technique. CONTRAST:  OMNIPAQUE IOHEXOL 350 MG/ML SOLN COMPARISON:  PA Lat chest yesterday is the only chest prior. FINDINGS: Cardiovascular: Satisfactory opacification of the pulmonary arteries to the segmental level. No evidence of pulmonary embolism. There is mild cardiomegaly and small pericardial effusion. There is no visible coronary artery or aortic atherosclerosis no aortic aneurysm or dissection. The great vessels opacify normally. Pulmonary veins are decompressed. Mediastinum/Nodes: There is a 2.3 cm heterogeneous nodule at the  junction of the right lobe and isthmus of the thyroid gland. Thyroid ultrasound follow-up is recommended. There is no intrathoracic or axillary adenopathy, tracheal filling defect or esophageal thickening. Lungs/Pleura: There is posterior atelectasis in the lower lung fields. No focal pneumonia is seen or appreciable pulmonary nodules. There is no pleural effusion, thickening or pneumothorax. There is mild elevation of the right hemidiaphragm. Upper Abdomen: The liver is hypodense consistent with a least mild steatosis. No acute abnormality is seen. Musculoskeletal: No abnormality of the visualized chest wall is seen. No acute or significant osseous findings. Review of the MIP images confirms the above findings. IMPRESSION: 1. No evidence of arterial dilatation or embolism is seen. 2. Mild cardiomegaly with a small pericardial effusion. No findings of acute CHF. 3. Posterior atelectasis in the lower lung fields without evidence of focal pneumonia. 4. 2.3 cm  heterogeneous nodule, junction of the right lobe and isthmus of thyroid. Ultrasound follow-up recommended. Reference: J Am Coll Radiol. 2015 Feb;12(2): 143-50 Electronically Signed   By: Almira BarKeith  Chesser M.D.   On: 06/24/2021

## 2022-04-10 IMAGING — US US THYROID
1 series · 13 of 25 positions shown · non-contrast
Comparison: 11/14/2012, 12/11/2012

CLINICAL DATA: Incidental on CT.

EXAM:
THYROID ULTRASOUND
TECHNIQUE: Ultrasound examination of the thyroid gland and adjacent soft
tissues was performed.

[Series 1: us thyroid · 13 of 35 slices shown]
[im 1/35]
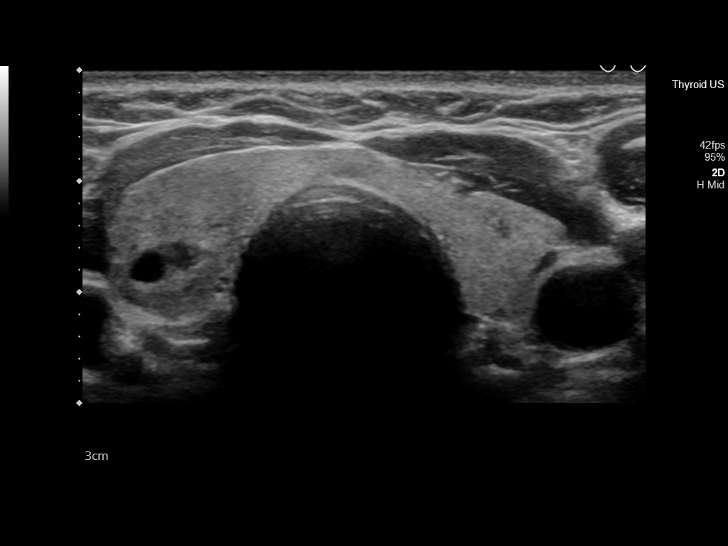
[im 3/35]
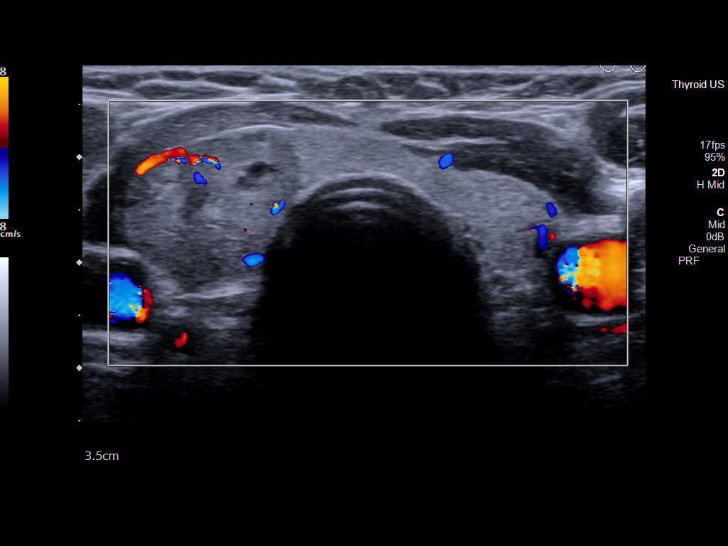
[im 6/35]
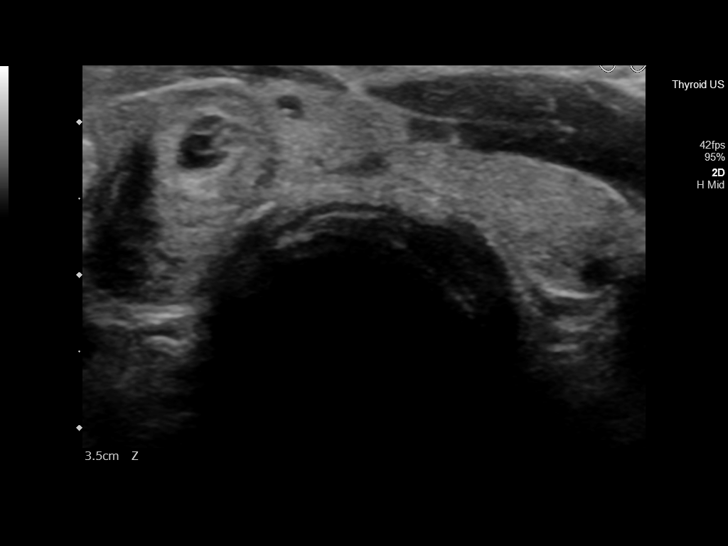
[im 9/35]
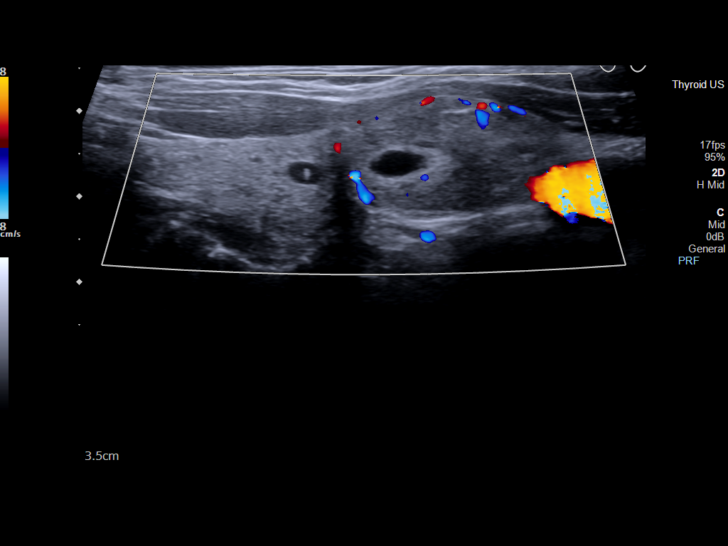
[im 12/35]
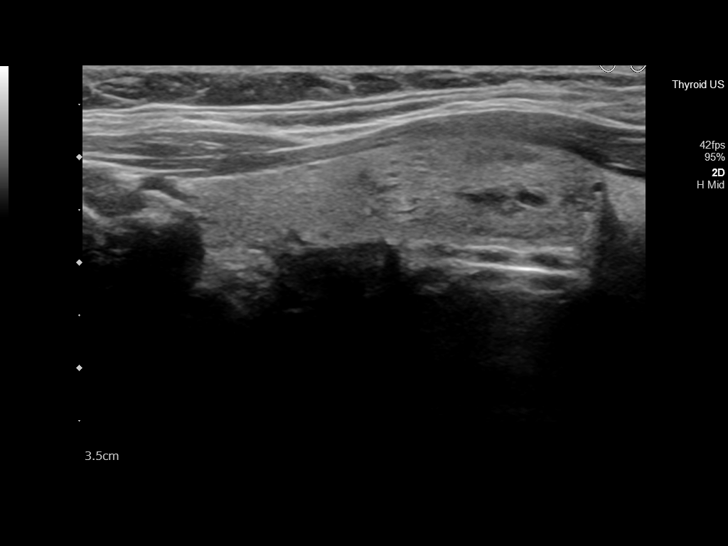
[im 15/35]
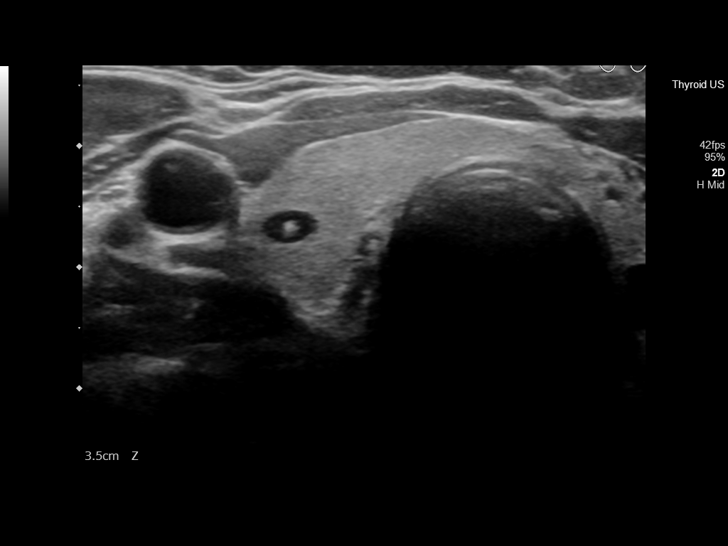
[im 18/35]
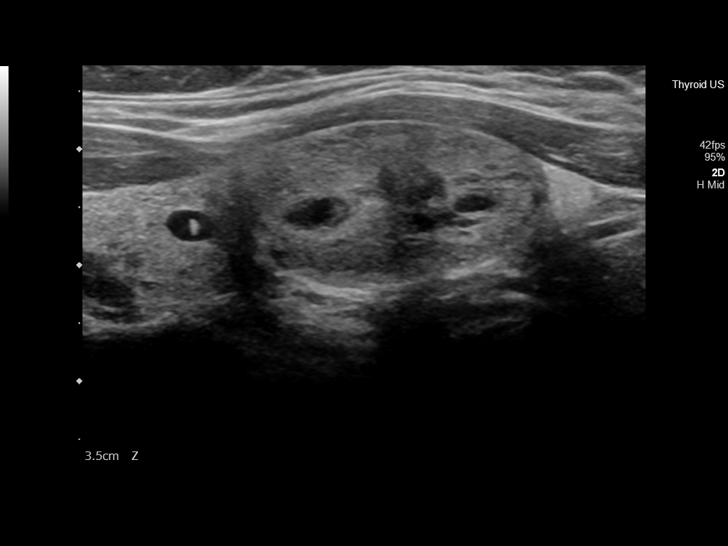
[im 20/35]
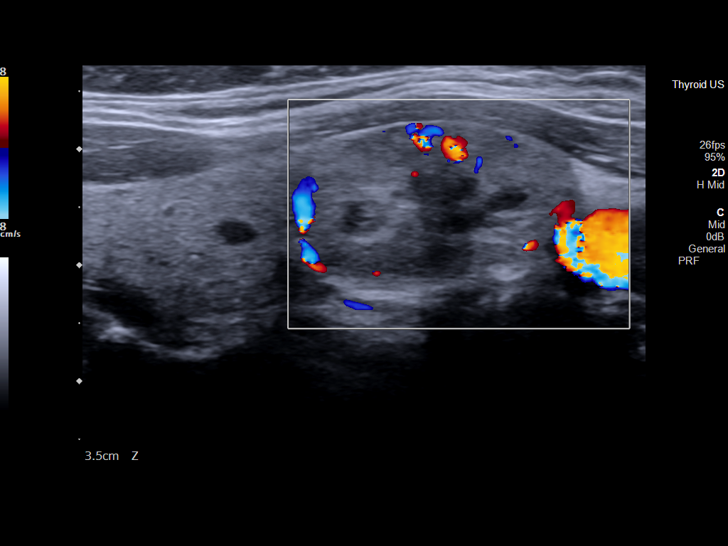
[im 23/35]
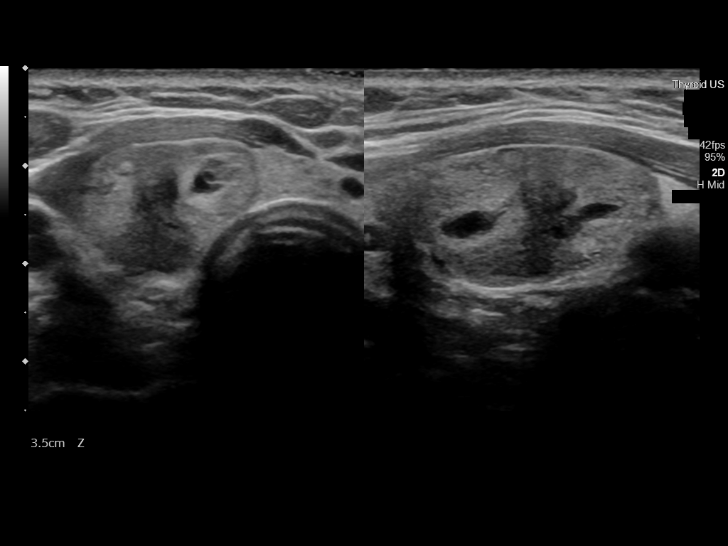
[im 26/35]
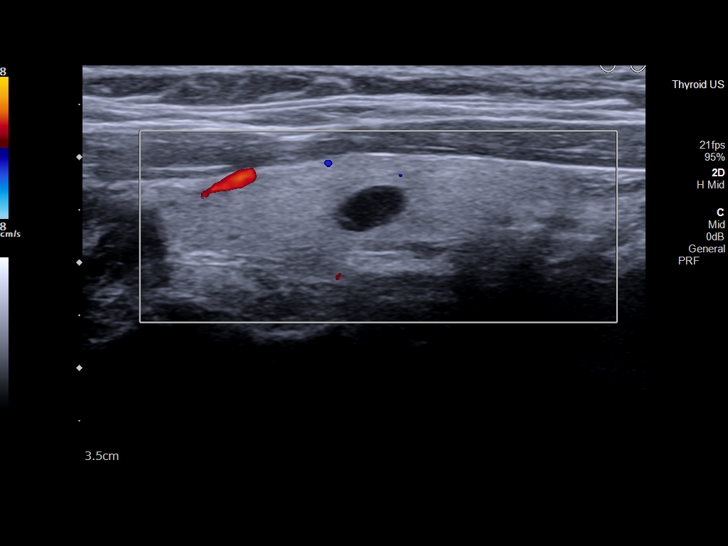
[im 29/35]
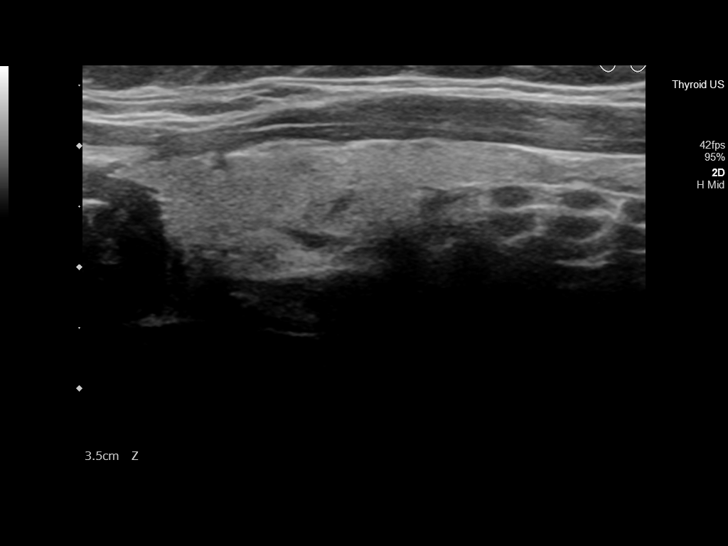
[im 32/35]
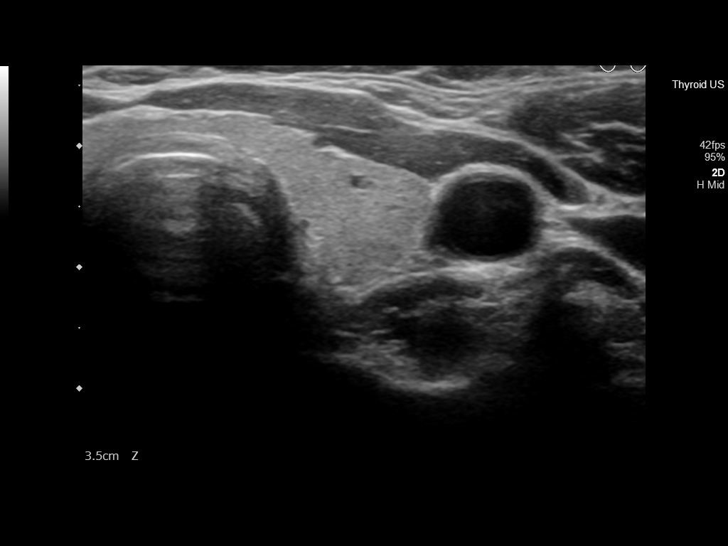
[im 35/35]
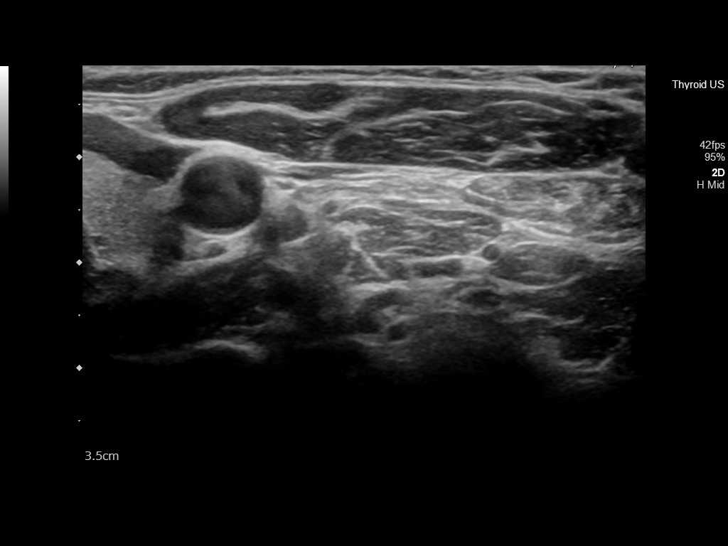

[13 of 25 positions shown; findings below may reference images not displayed]

FINDINGS: Parenchymal Echotexture: Mildly heterogenous

Isthmus: 0.3 cm, previously 0.4 cm

Right lobe: 5.0 x 1.6 x 1.5 cm, previously 4.3 x 1.2 x 1.3 cm

Left lobe: 4.2 x 1.0 x 1.2 cm, previously 4.3 x 1.0 x 1.3 cm

_________________________________________________________

Estimated total number of nodules >/= 1 cm: 1

Number of spongiform nodules >/=  2 cm not described below (TR1): 0

Number of mixed cystic and solid nodules >/= 1.5 cm not described
below (TR2): 0

_________________________________________________________

Nodule # 1:

Prior biopsy: Yes (12/11/2012)

Location: Right; Inferior

Maximum size: 2.6 cm; Other 2 dimensions: 1.8 x 1.4 cm, previously,
2.2 x 1.6 x 1.1 cm

Composition: solid/almost completely solid (2)

Echogenicity: isoechoic (1)

Shape: not taller-than-wide (0)

Margins: smooth (0)

Echogenic foci: none (0)

ACR TI-RADS total points: 3.

ACR TI-RADS risk category:  TR3 (3 points).

Significant change in size (>/= 20% in two dimensions and minimal
increase of 2 mm): No

Change in features: No

Change in ACR TI-RADS risk category: No

ACR TI-RADS recommendations:

Correlation prior biopsy results.

_________________________________________________________

No new discrete thyroid nodules.  No cervical lymphadenopathy.
IMPRESSION: Slight interval enlargement of previously biopsied right inferior
solid thyroid nodule (labeled 1, 2.6 cm, previously 2.2 cm).
Recommend correlation with prior biopsy results.

The above is in keeping with the ACR TI-RADS recommendations - [HOSPITAL] 6723;[DATE].

## 2022-04-10 IMAGING — CT CT ANGIO CHEST
2 of 7 series · 18 of 46 positions shown · IV contrast (APPLIED)
Comparison: PA Lat chest yesterday is the only chest prior.

CLINICAL DATA: Midsternal chest pain.

EXAM:
CT ANGIOGRAPHY CHEST WITH CONTRAST
TECHNIQUE: Multidetector CT imaging of the chest was performed using the
standard protocol during bolus administration of intravenous
contrast. Multiplanar CT image reconstructions and MIPs were
obtained to evaluate the vascular anatomy.

[Series 5: thins · axial · 0.69mm/px · z∈[-663,-440]mm · 15 of 311 slices shown]
[im 16/311  lung]
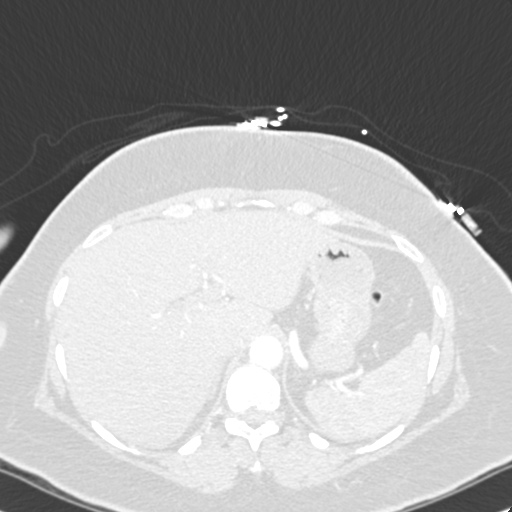
[im 32/311  soft-tissue]
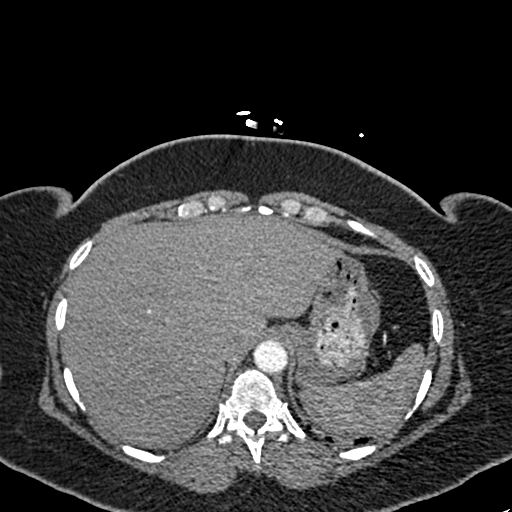
[im 63/311  lung]
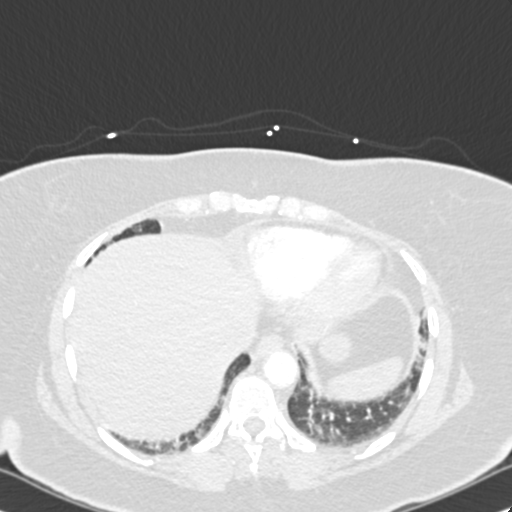
[im 78/311  soft-tissue]
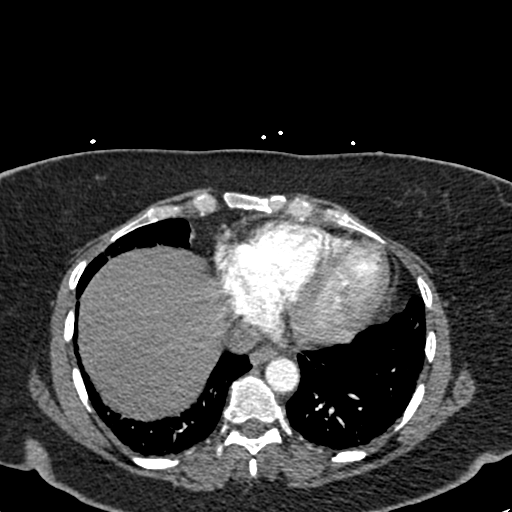
[im 94/311  lung]
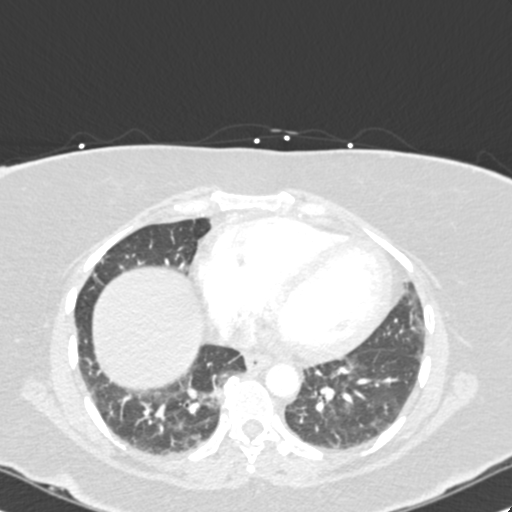
[im 109/311  soft-tissue]
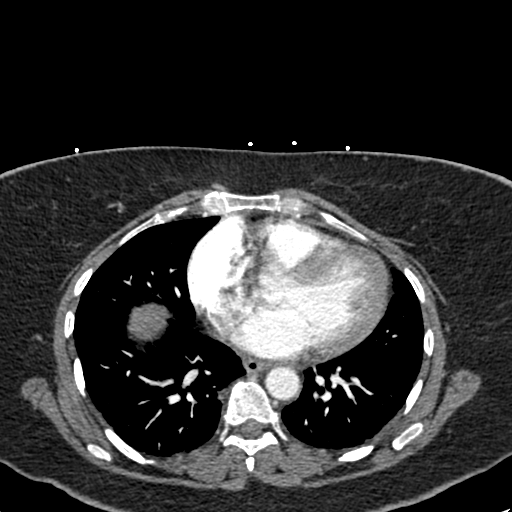
[im 140/311  lung]
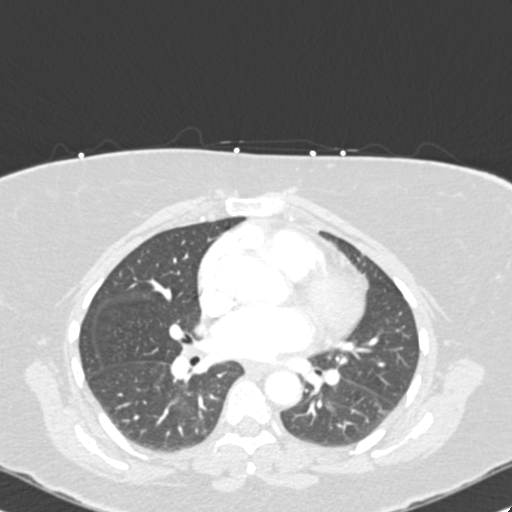
[im 156/311  soft-tissue]
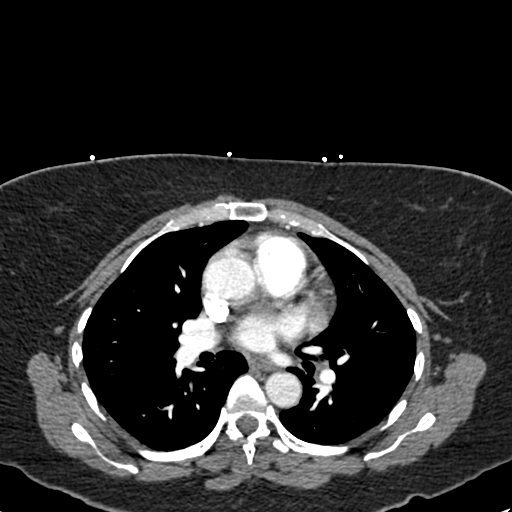
[im 171/311  lung]
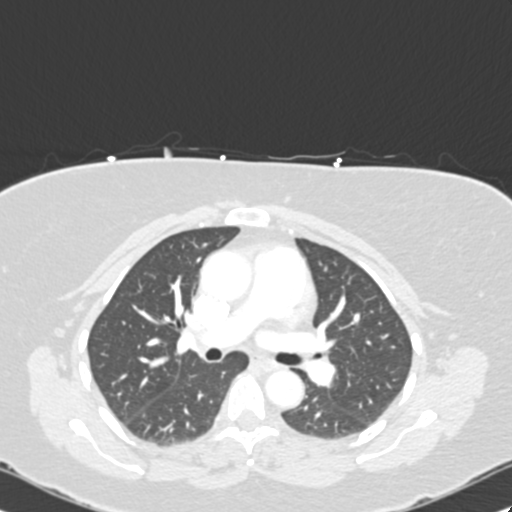
[im 202/311  soft-tissue]
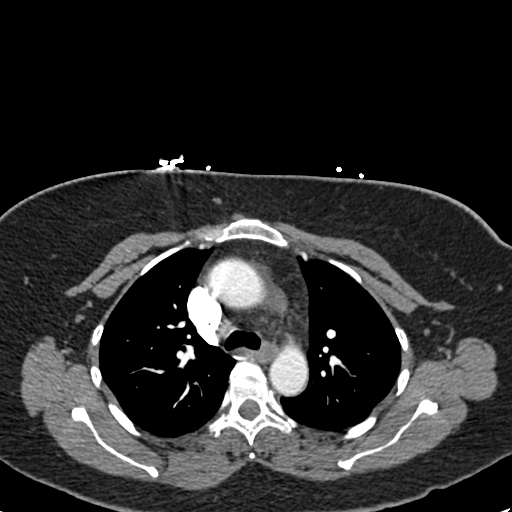
[im 218/311  lung]
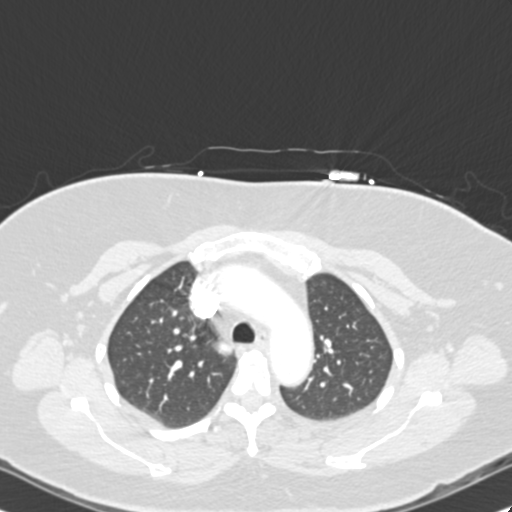
[im 233/311  soft-tissue]
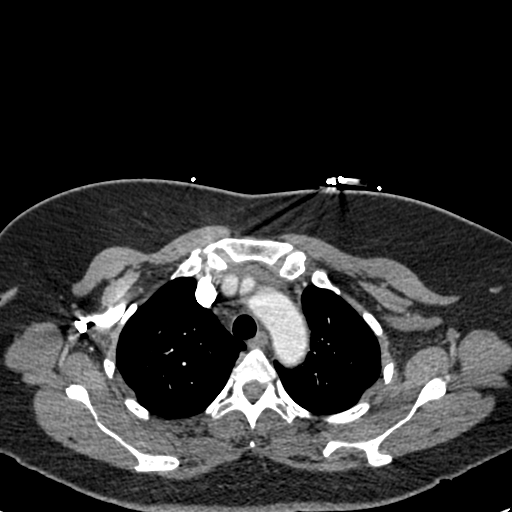
[im 249/311  lung]
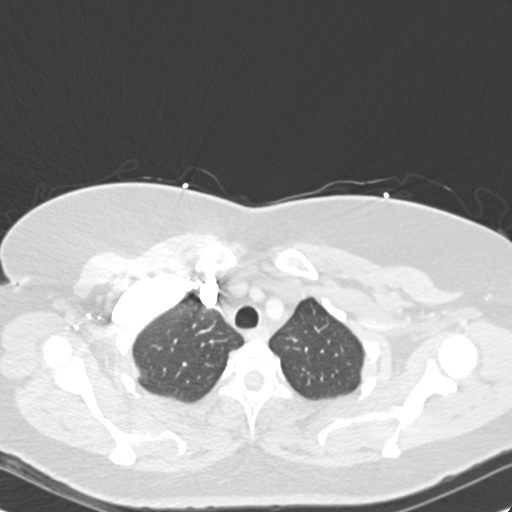
[im 280/311  soft-tissue]
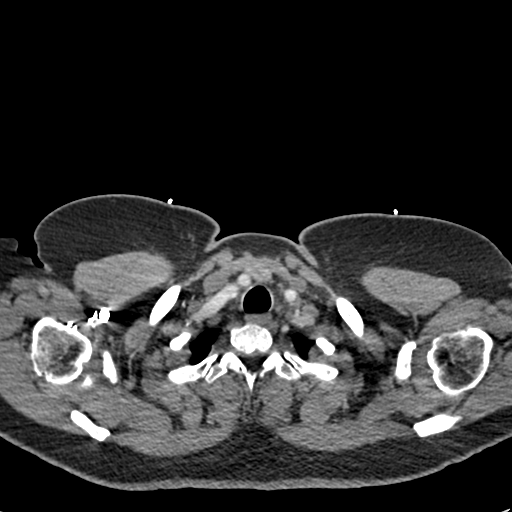
[im 295/311  lung]
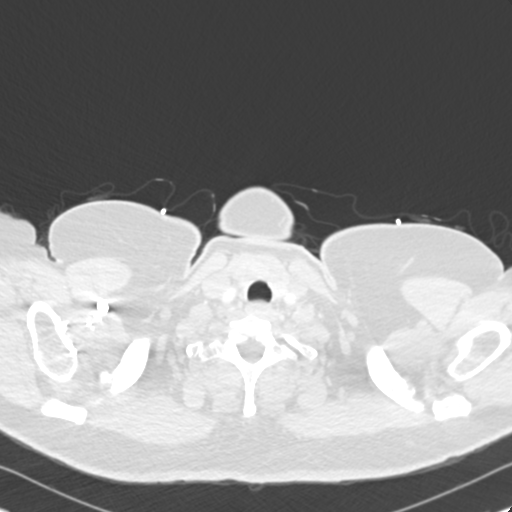

[Series 7: coronal mpr · coronal · 0.50mm/px · 3 of 74 slices shown]
[im 19/74  soft-tissue]
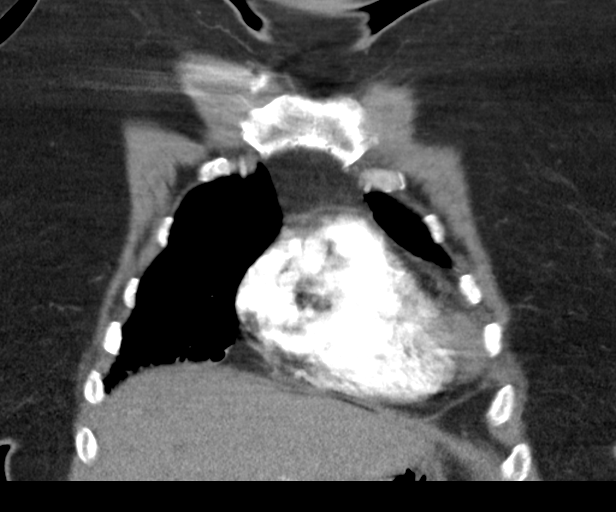
[im 37/74  soft-tissue]
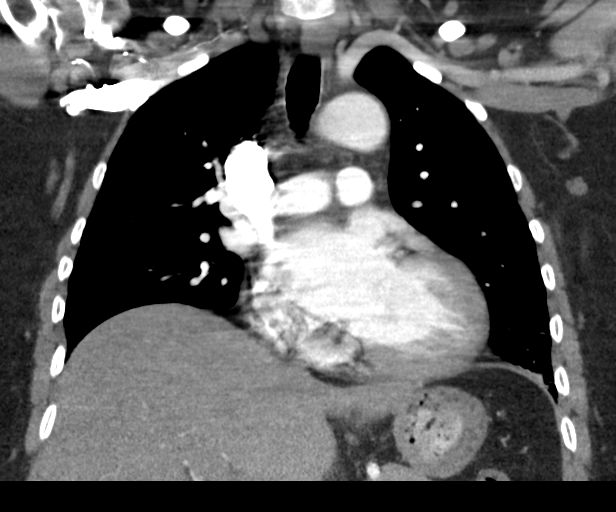
[im 55/74  soft-tissue]
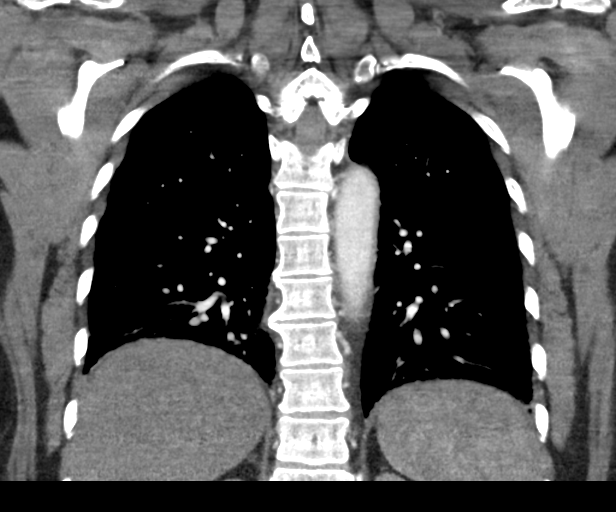

[18 of 46 positions shown; findings below may reference images not displayed]

RADIATION DOSE REDUCTION: This exam was performed according to the
departmental dose-optimization program which includes automated
exposure control, adjustment of the mA and/or kV according to
patient size and/or use of iterative reconstruction technique.

CONTRAST:  100mL OMNIPAQUE IOHEXOL 350 MG/ML SOLN
FINDINGS: Cardiovascular: Satisfactory opacification of the pulmonary arteries
to the segmental level. No evidence of pulmonary embolism. There is
mild cardiomegaly and small pericardial effusion. There is no
visible coronary artery or aortic atherosclerosis no aortic aneurysm
or dissection. The great vessels opacify normally. Pulmonary veins
are decompressed.

Mediastinum/Nodes: There is a 2.3 cm heterogeneous nodule at the
junction of the right lobe and isthmus of the thyroid gland. Thyroid
ultrasound follow-up is recommended. There is no intrathoracic or
axillary adenopathy, tracheal filling defect or esophageal
thickening.

Lungs/Pleura: There is posterior atelectasis in the lower lung
fields. No focal pneumonia is seen or appreciable pulmonary nodules.
There is no pleural effusion, thickening or pneumothorax. There is
mild elevation of the right hemidiaphragm.

Upper Abdomen: The liver is hypodense consistent with a least mild
steatosis. No acute abnormality is seen.

Musculoskeletal: No abnormality of the visualized chest wall is
seen. No acute or significant osseous findings.

Review of the MIP images confirms the above findings.
IMPRESSION: 1. No evidence of arterial dilatation or embolism is seen.
2. Mild cardiomegaly with a small pericardial effusion. No findings
of acute CHF.
3. Posterior atelectasis in the lower lung fields without evidence
of focal pneumonia.
4. 2.3 cm heterogeneous nodule, junction of the right lobe and
isthmus of thyroid. Ultrasound follow-up recommended. Reference: [HOSPITAL]. [DATE]): 143-50

## 2023-01-30 ENCOUNTER — Telehealth: Payer: Self-pay

## 2023-01-30 NOTE — Telephone Encounter (Signed)
Pt calling to schedule her screening colonoscopy. Please call before 1:00pm tomorrow as patient has other appts and will not be available to take the call.

## 2023-01-31 ENCOUNTER — Telehealth: Payer: Self-pay | Admitting: *Deleted

## 2023-01-31 ENCOUNTER — Other Ambulatory Visit: Payer: Self-pay | Admitting: *Deleted

## 2023-01-31 DIAGNOSIS — Z83719 Family history of colon polyps, unspecified: Secondary | ICD-10-CM

## 2023-01-31 DIAGNOSIS — Z1211 Encounter for screening for malignant neoplasm of colon: Secondary | ICD-10-CM

## 2023-01-31 MED ORDER — NA SULFATE-K SULFATE-MG SULF 17.5-3.13-1.6 GM/177ML PO SOLN: 1.0000 | Freq: Once | ORAL | 0 refills | Status: AC

## 2023-01-31 NOTE — Telephone Encounter (Signed)
Colonoscopy schedule on 03/02/2023 with Dr Servando Snare at [facility]

## 2023-01-31 NOTE — Telephone Encounter (Signed)
Gastroenterology Pre-Procedure Review  Request Date: 03/02/2023 Requesting Physician: Dr. Servando Snare  PATIENT REVIEW QUESTIONS: The patient responded to the following health history questions as indicated:    1. Are you having any GI issues? no 2. Do you have a personal history of Polyps? no 3. Do you have a family history of Colon Cancer or Polyps? yes (mom, brother, uncle) 4. Diabetes Mellitus? no 5. Joint replacements in the past 12 months?no 6. Major health problems in the past 3 months?no 7. Any artificial heart valves, MVP, or defibrillator?no    MEDICATIONS & ALLERGIES:    Patient reports the following regarding taking any anticoagulation/antiplatelet therapy:   Plavix, Coumadin, Eliquis, Xarelto, Lovenox, Pradaxa, Brilinta, or Effient? no Aspirin? no  Patient confirms/reports the following medications:  Current Outpatient Medications  Medication Sig Dispense Refill   Cyanocobalamin (B-12 COMPLIANCE INJECTION) 1000 MCG/ML KIT Inject 1,000 mcg as directed once a week.     Lysine 1000 MG TABS Take by oral route.     Na Sulfate-K Sulfate-Mg Sulf 17.5-3.13-1.6 GM/177ML SOLN Take 1 kit by mouth once for 1 dose. 354 mL 0   testosterone cypionate (DEPOTESTOSTERONE CYPIONATE) 200 MG/ML injection Inject 100 mg into the muscle every 14 (fourteen) days.     tirzepatide (ZEPBOUND) 5 MG/0.5ML Pen Inject 5 mg into the skin once a week.     No current facility-administered medications for this visit.    Patient confirms/reports the following allergies:  Allergies  Allergen Reactions   Sulfa Antibiotics Hives    No orders of the defined types were placed in this encounter.   AUTHORIZATION INFORMATION Primary Insurance: 1D#: Group #:  Secondary Insurance: 1D#: Group #:  SCHEDULE INFORMATION: Date: 03/02/2023 Time: Location:  [facility]

## 2023-03-02 ENCOUNTER — Ambulatory Visit
Admission: RE | Admit: 2023-03-02 | Discharge: 2023-03-02 | Disposition: A | Discharge status: Home / Self Care | Payer: Managed Care, Other (non HMO) | Attending: Gastroenterology | Admitting: Gastroenterology

## 2023-03-02 ENCOUNTER — Encounter
Admission: RE | Disposition: A | Discharge status: Home / Self Care | Payer: Self-pay | Source: Home / Self Care | Attending: Gastroenterology

## 2023-03-02 ENCOUNTER — Ambulatory Visit: Payer: Managed Care, Other (non HMO) | Admitting: Certified Registered"

## 2023-03-02 ENCOUNTER — Other Ambulatory Visit: Payer: Self-pay

## 2023-03-02 ENCOUNTER — Encounter: Payer: Self-pay | Admitting: Gastroenterology

## 2023-03-02 DIAGNOSIS — Z87891 Personal history of nicotine dependence: Secondary | ICD-10-CM | POA: Insufficient documentation

## 2023-03-02 DIAGNOSIS — Z1211 Encounter for screening for malignant neoplasm of colon: Secondary | ICD-10-CM | POA: Diagnosis not present

## 2023-03-02 DIAGNOSIS — D124 Benign neoplasm of descending colon: Secondary | ICD-10-CM | POA: Diagnosis not present

## 2023-03-02 DIAGNOSIS — K219 Gastro-esophageal reflux disease without esophagitis: Secondary | ICD-10-CM | POA: Insufficient documentation

## 2023-03-02 DIAGNOSIS — Z6841 Body Mass Index (BMI) 40.0 and over, adult: Secondary | ICD-10-CM | POA: Insufficient documentation

## 2023-03-02 DIAGNOSIS — D125 Benign neoplasm of sigmoid colon: Secondary | ICD-10-CM | POA: Insufficient documentation

## 2023-03-02 DIAGNOSIS — Z0001 Encounter for general adult medical examination with abnormal findings: Secondary | ICD-10-CM

## 2023-03-02 DIAGNOSIS — K635 Polyp of colon: Secondary | ICD-10-CM

## 2023-03-02 DIAGNOSIS — Z0000 Encounter for general adult medical examination without abnormal findings: Secondary | ICD-10-CM

## 2023-03-02 DIAGNOSIS — K640 First degree hemorrhoids: Secondary | ICD-10-CM | POA: Diagnosis not present

## 2023-03-02 HISTORY — PX: COLONOSCOPY WITH PROPOFOL: SHX5780

## 2023-03-02 HISTORY — PX: POLYPECTOMY: SHX5525

## 2023-03-02 LAB — POCT PREGNANCY, URINE: Preg Test, Ur: NEGATIVE

## 2023-03-02 SURGERY — COLONOSCOPY WITH PROPOFOL
Anesthesia: General

## 2023-03-02 MED ORDER — PROPOFOL 10 MG/ML IV BOLUS
INTRAVENOUS | Status: AC
  Filled 2023-03-02: qty 40

## 2023-03-02 MED ORDER — PROPOFOL 10 MG/ML IV BOLUS
INTRAVENOUS | Status: DC | PRN
  Administered 2023-03-02: 110 mg via INTRAVENOUS
  Administered 2023-03-02: 120 ug/kg/min via INTRAVENOUS

## 2023-03-02 MED ORDER — LIDOCAINE HCL (PF) 2 % IJ SOLN
INTRAMUSCULAR | Status: DC | PRN
  Administered 2023-03-02: 100 mg via INTRADERMAL

## 2023-03-02 MED ORDER — SODIUM CHLORIDE 0.9 % IV SOLN: INTRAVENOUS | Status: DC

## 2023-03-02 MED ORDER — LIDOCAINE HCL (PF) 2 % IJ SOLN
INTRAMUSCULAR | Status: AC
  Filled 2023-03-02: qty 5

## 2023-03-02 NOTE — Anesthesia Preprocedure Evaluation (Signed)
Anesthesia Evaluation  Patient identified by MRN, date of birth, ID band Patient awake    Reviewed: Allergy & Precautions, H&P , NPO status , Patient's Chart, lab work & pertinent test results, reviewed documented beta blocker date and time   History of Anesthesia Complications Negative for: history of anesthetic complications  Airway Mallampati: II  TM Distance: >3 FB Neck ROM: full    Dental  (+) Dental Advidsory Given, Caps   Pulmonary neg pulmonary ROS, former smoker   Pulmonary exam normal breath sounds clear to auscultation       Cardiovascular Exercise Tolerance: Good (-) hypertension(-) angina (-) Past MI and (-) Cardiac Stents negative cardio ROS Normal cardiovascular exam(-) dysrhythmias (-) Valvular Problems/Murmurs Rhythm:regular Rate:Normal     Neuro/Psych negative neurological ROS  negative psych ROS   GI/Hepatic Neg liver ROS,GERD  ,,  Endo/Other  neg diabetes  Class 3 obesity  Renal/GU negative Renal ROS  negative genitourinary   Musculoskeletal   Abdominal   Peds  Hematology negative hematology ROS (+)   Anesthesia Other Findings Past Medical History: No date: Bursitis No date: Ruptured disk   Reproductive/Obstetrics negative OB ROS                             Anesthesia Physical Anesthesia Plan  ASA: 2  Anesthesia Plan: General   Post-op Pain Management:    Induction: Intravenous  PONV Risk Score and Plan: 3 and Propofol infusion and TIVA  Airway Management Planned: Natural Airway and Nasal Cannula  Additional Equipment:   Intra-op Plan:   Post-operative Plan:   Informed Consent: I have reviewed the patients History and Physical, chart, labs and discussed the procedure including the risks, benefits and alternatives for the proposed anesthesia with the patient or authorized representative who has indicated his/her understanding and acceptance.      Dental Advisory Given  Plan Discussed with: Anesthesiologist, CRNA and Surgeon  Anesthesia Plan Comments:         Anesthesia Quick Evaluation

## 2023-03-02 NOTE — H&P (Signed)
   Midge Minium, MD [facility] [address], [address] [place], [place] [address] Phone: [phone] Fax : [phone]  Primary Care Physician:  Eden Emms, NP Primary Gastroenterologist:  Dr. Servando Snare  Pre-Procedure History & Physical: HPI:  Terri Saunders is a 52 y.o. female is here for a screening colonoscopy.   Past Medical History:  Diagnosis Date   Bursitis    Ruptured disk     Past Surgical History:  Procedure Laterality Date   DILATION AND CURETTAGE OF UTERUS  2003   following a miscarriage   LAPAROSCOPY  2004   diagnostic for infertility work up    Prior to Admission medications   Medication Sig Start Date End Date Taking? Authorizing Provider  Cyanocobalamin (B-12 COMPLIANCE INJECTION) 1000 MCG/ML KIT Inject 1,000 mcg as directed once a week.   Yes [provider]  Lysine 1000 MG TABS Take by oral route.   Yes [provider]  testosterone cypionate (DEPOTESTOSTERONE CYPIONATE) 200 MG/ML injection Inject 100 mg into the muscle every 14 (fourteen) days.   Yes [provider]  tirzepatide (ZEPBOUND) 5 MG/0.5ML Pen Inject 5 mg into the skin once a week.   Yes [provider]    Allergies as of 01/31/2023 - Review Complete 06/24/2021  Allergen Reaction Noted   Sulfa antibiotics Hives 08/11/2014    Family History  Problem Relation Age of Onset   Hyperlipidemia Mother    Irritable bowel syndrome Brother    Colon cancer Maternal Grandmother    Heart disease Maternal Grandfather    Colon cancer Maternal Grandfather    Coronary artery disease Paternal Grandmother    Hypertension Paternal Grandmother    Heart attack Paternal Grandfather     Social History   Socioeconomic History   Marital status: Married    Spouse name: Not on file   Number of children: Not on file   Years of education: Not on file   Highest education level: Not on file  Occupational History   Not on file  Tobacco Use   Smoking status: Former   Smokeless  tobacco: Never  Vaping Use   Vaping status: Never Used  Substance and Sexual Activity   Alcohol use: Yes    Comment: occ   Drug use: No   Sexual activity: Yes    Birth control/protection: None  Other Topics Concern   Not on file  Social History Narrative   Not on file   Social Drivers of Health   Financial Resource Strain: Not on file  Food Insecurity: Not on file  Transportation Needs: Not on file  Physical Activity: Not on file  Stress: Not on file  Social Connections: Not on file  Intimate Partner Violence: Not on file    Review of Systems: See HPI, otherwise negative ROS  Physical Exam: LMP 02/08/2023 (Exact Date)  General:   Alert,  pleasant and cooperative in NAD Head:  Normocephalic and atraumatic. Neck:  Supple; no masses or thyromegaly. Lungs:  Clear throughout to auscultation.    Heart:  Regular rate and rhythm. Abdomen:  Soft, nontender and nondistended. Normal bowel sounds, without guarding, and without rebound.   Neurologic:  Alert and  oriented x4;  grossly normal neurologically.  Impression/Plan: Terri Saunders is now here to undergo a screening colonoscopy.  Risks, benefits, and alternatives regarding colonoscopy have been reviewed with the patient.  Questions have been answered.  All parties agreeable.

## 2023-03-02 NOTE — Transfer of Care (Addendum)
Immediate Anesthesia Transfer of Care Note  Patient: Terri Saunders  Procedure(s) Performed: COLONOSCOPY WITH PROPOFOL POLYPECTOMY  Patient Location: Endoscopy Unit  Anesthesia Type:General  Level of Consciousness: drowsy  Airway & Oxygen Therapy: Patient Spontanous Breathing  Post-op Assessment: Report given to RN and Post -op Vital signs reviewed and stable  Post vital signs: Reviewed and stable  Last Vitals:  Vitals Value Taken Time  BP 121/83 03/02/23 0954  Temp 35.7 C 03/02/23 0954  Pulse 82 03/02/23 0954  Resp 22 0954  SpO2 98 % 03/02/23 0954  Vitals shown include unfiled device data.  Last Pain:  Vitals:   03/02/23 0953  TempSrc: Temporal  PainSc:          Complications: No notable events documented.

## 2023-03-02 NOTE — Op Note (Signed)
[facility] Gastroenterology Patient Name: Terri Saunders Procedure Date: 03/02/2023 9:34 AM MRN: [id number] Account #: [id number] Date of Birth: 27-Aug-1970 Admit Type: Outpatient Age: 52 Room: [facility] ENDO ROOM 4 Gender: Female Note Status: Finalized Instrument Name: Nelda Marseille [id number] Procedure:             Colonoscopy Indications:           Screening for colorectal malignant neoplasm Providers:             Midge Minium MD, MD Referring MD:          No Local Md, MD (Referring MD) Medicines:             Propofol per Anesthesia Complications:         No immediate complications. Procedure:             Pre-Anesthesia Assessment:                        - Prior to the procedure, a History and Physical was                         performed, and patient medications and allergies were                         reviewed. The patient's tolerance of previous                         anesthesia was also reviewed. The risks and benefits                         of the procedure and the sedation options and risks                         were discussed with the patient. All questions were                         answered, and informed consent was obtained. Prior                         Anticoagulants: The patient has taken no anticoagulant                         or antiplatelet agents. ASA Grade Assessment: II - A                         patient with mild systemic disease. After reviewing                         the risks and benefits, the patient was deemed in                         satisfactory condition to undergo the procedure.                        After obtaining informed consent, the colonoscope was                         passed under direct vision. Throughout the procedure,  the patient's blood pressure, pulse, and oxygen                         saturations were monitored continuously. The                         Colonoscope was introduced through the  anus and                         advanced to the the cecum, identified by appendiceal                         orifice and ileocecal valve. The colonoscopy was                         performed without difficulty. The patient tolerated                         the procedure well. The quality of the bowel                         preparation was excellent. Findings:      The perianal and digital rectal examinations were normal.      A 5 mm polyp was found in the sigmoid colon. The polyp was sessile. The       polyp was removed with a cold snare. Resection and retrieval were       complete.      A 5 mm polyp was found in the descending colon. The polyp was sessile.       The polyp was removed with a cold snare. Resection and retrieval were       complete.      Non-bleeding internal hemorrhoids were found during retroflexion. The       hemorrhoids were Grade I (internal hemorrhoids that do not prolapse). Impression:            - One 5 mm polyp in the sigmoid colon, removed with a                         cold snare. Resected and retrieved.                        - One 5 mm polyp in the descending colon, removed with                         a cold snare. Resected and retrieved.                        - Non-bleeding internal hemorrhoids. Recommendation:        - Discharge patient to home.                        - Resume previous diet.                        - Continue present medications.                        - Await pathology results.                        -  If the pathology report reveals adenomatous tissue,                         then repeat the colonoscopy for surveillance in 7                         years. Procedure Code(s):     --- Professional ---                        [phone], Colonoscopy, flexible; with removal of                         tumor(s), polyp(s), or other lesion(s) by snare                         technique Diagnosis Code(s):     --- Professional ---                         Z12.11, Encounter for screening for malignant neoplasm                         of colon                        D12.5, Benign neoplasm of sigmoid colon CPT copyright 2022 [employer]. All rights reserved. The codes documented in this report are preliminary and upon coder review may  be revised to meet current compliance requirements. Midge Minium MD, MD 03/02/2023 9:51:28 AM This report has been signed electronically. Number of Addenda: 0 Note Initiated On: 03/02/2023 9:34 AM Scope Withdrawal Time: 0 hours 5 minutes 41 seconds  Total Procedure Duration: 0 hours 11 minutes 8 seconds  Estimated Blood Loss:  Estimated blood loss: none.      [facility]

## 2023-03-03 ENCOUNTER — Encounter: Payer: Self-pay | Admitting: Gastroenterology

## 2023-03-03 LAB — SURGICAL PATHOLOGY

## 2023-03-06 ENCOUNTER — Encounter: Payer: Self-pay | Admitting: Gastroenterology

## 2023-03-08 ENCOUNTER — Ambulatory Visit (INDEPENDENT_AMBULATORY_CARE_PROVIDER_SITE_OTHER): Payer: Managed Care, Other (non HMO) | Admitting: Nurse Practitioner

## 2023-03-08 ENCOUNTER — Encounter: Payer: Self-pay | Admitting: Nurse Practitioner

## 2023-03-08 VITALS — BP 118/82 | HR 70 | Temp 97.9°F | Ht 64.0 in | Wt 234.2 lb

## 2023-03-08 DIAGNOSIS — Z1159 Encounter for screening for other viral diseases: Secondary | ICD-10-CM

## 2023-03-08 DIAGNOSIS — E559 Vitamin D deficiency, unspecified: Secondary | ICD-10-CM

## 2023-03-08 DIAGNOSIS — R5383 Other fatigue: Secondary | ICD-10-CM

## 2023-03-08 DIAGNOSIS — E041 Nontoxic single thyroid nodule: Secondary | ICD-10-CM

## 2023-03-08 DIAGNOSIS — Z87891 Personal history of nicotine dependence: Secondary | ICD-10-CM | POA: Insufficient documentation

## 2023-03-08 DIAGNOSIS — Z13220 Encounter for screening for lipoid disorders: Secondary | ICD-10-CM

## 2023-03-08 DIAGNOSIS — R1901 Right upper quadrant abdominal swelling, mass and lump: Secondary | ICD-10-CM | POA: Diagnosis not present

## 2023-03-08 DIAGNOSIS — Z0000 Encounter for general adult medical examination without abnormal findings: Secondary | ICD-10-CM | POA: Diagnosis not present

## 2023-03-08 DIAGNOSIS — Z0001 Encounter for general adult medical examination with abnormal findings: Secondary | ICD-10-CM

## 2023-03-08 NOTE — Assessment & Plan Note (Signed)
Ambiguous nature check CBC, TSH, vitamin D, B12.  If all normal consider sleep study

## 2023-03-08 NOTE — Assessment & Plan Note (Signed)
Pending vitamin D levels

## 2023-03-08 NOTE — Assessment & Plan Note (Signed)
Pending microscopic urine for microscopic hematuria

## 2023-03-08 NOTE — Assessment & Plan Note (Signed)
Incidental finding on exam.  Ultrasound right upper quadrant placed patient given information to call and schedule at her convenience

## 2023-03-08 NOTE — Assessment & Plan Note (Signed)
History of the same.  Patient had a biopsy per her report came back normal most recent ultrasound shows slightly creased size of nodules.  Will repeat ultrasound for surveillance order placed patient given information to call and set up at her convenience.

## 2023-03-08 NOTE — Patient Instructions (Signed)
Nice to see you today I will be in touch with the labs once I have reviewed them Follow up with me in 1 year, sooner if you need me  Call and schedule the ultrasounds

## 2023-03-08 NOTE — Assessment & Plan Note (Signed)
Discussed age-appropriate immunizations and screening exams.  Patient is up-to-date with all age-appropriate vaccinations she would like.  She did refuse flu and shingles vaccine.  Did review patient's personal, surgical, social, family histories.  Patient is up-to-date on CRC screening, mammogram, cervical cancer screening.  Patient given information at discharge about preventative healthcare maintenance with anticipatory guidance.

## 2023-03-08 NOTE — Progress Notes (Signed)
New Patient Office Visit  Subjective    Patient ID: Terri Saunders, female    DOB: 04/16/1970  Age: 52 y.o. MRN: [id number]  CC:  Chief Complaint  Patient presents with   Establish Care    Physical. Labs go to [facility].     HPI Terri Saunders presents to establish care   for complete physical and follow up of chronic conditions.  Obesity: managed through [facility] in [place]. Stats that they manage the zepbound and testosterone   Thyroid nodule: [place] 06/2021 with slight enlargement of nodules. Previous biopsy that was normal per patient report   Immunizations: -Tetanus: Completed in 2018 -Influenza: refused  -Shingles: Refused -Pneumonia: Completed   Diet: Fair diet. She will eat 3 meals a day and rare snacks. She will do coffee diet dr pepper and water  Exercise: No regular exercise.  Eye exam: Completes annually . Glasses.  Dental exam: Completes semi-annually    Colonoscopy: Completed in 03/02/2023, repeat  in 5 years  Lung Cancer Screening: N/A  Pap smear: Pap smear done October 24 with GYN Dr. Rana Snare   Mammogram: [facility] 12[facility]  Sleep: goes to bed around 12 and will get up around 3, 5, 8. Does not feel rested. Does snore      Outpatient Encounter Medications as of 03/08/2023  Medication Sig   Cyanocobalamin (B-12 COMPLIANCE INJECTION) 1000 MCG/ML KIT Inject 1,000 mcg as directed once a week.   Loratadine 10 MG CAPS Take by oral route.   Lysine 1000 MG TABS Take by oral route.   testosterone cypionate (DEPOTESTOSTERONE CYPIONATE) 200 MG/ML injection Inject 100 mg into the muscle every 14 (fourteen) days.   tirzepatide (ZEPBOUND) 5 MG/0.5ML Pen Inject 5 mg into the skin once a week.   No facility-administered encounter medications on file as of 03/08/2023.    Past Medical History:  Diagnosis Date   Bursitis    Ruptured disk     Past Surgical History:  Procedure Laterality Date   COLONOSCOPY WITH PROPOFOL N/A 03/02/2023    Procedure: COLONOSCOPY WITH PROPOFOL;  Surgeon: Midge Minium, MD;  Location: [facility] END[facility]COPY;  Service: Endoscopy;  Laterality: N/A;   DILATION AND CURETTAGE OF UTERUS  2003   following a miscarriage   LAPAROSCOPY  2004   diagnostic for infertility work up   POLYPECTOMY  03/02/2023   Procedure: POLYPECTOMY;  Surgeon: Midge Minium, MD;  Location: [facility] ENDOSCOPY;  Service: Endoscopy;;    Family History  Problem Relation Age of Onset   Cancer Mother        ovarain   Hyperlipidemia Mother    Atrial fibrillation Mother    Hyperlipidemia Father    Irritable bowel syndrome Brother    Colon cancer Maternal Grandmother    Heart disease Maternal Grandfather    Colon cancer Maternal Grandfather    Coronary artery disease Paternal Grandmother    Hypertension Paternal Grandmother    Heart attack Paternal Grandfather     Social History   Socioeconomic History   Marital status: Married    Spouse name: Trey Paula   Number of children: 1   Years of education: Not on file   Highest education level: Not on file  Occupational History   Not on file  Tobacco Use   Smoking status: Former    Average packs/day: 0.5 packs/day for 17.0 years (8.5 ttl pk-yrs)    Types: Cigarettes    Start date: 03/21/1986   Smokeless tobacco: Never  Vaping Use  Vaping status: Never Used  Substance and Sexual Activity   Alcohol use: Yes    Comment: occ   Drug use: No   Sexual activity: Yes    Birth control/protection: None  Other Topics Concern   Not on file  Social History Narrative   Full time [facility]    Social Drivers of Health   Financial Resource Strain: Not on file  Food Insecurity: Not on file  Transportation Needs: Not on file  Physical Activity: Not on file  Stress: Not on file  Social Connections: Not on file  Intimate Partner Violence: Not on file    Review of Systems  Constitutional:  Negative for chills and fever.  Respiratory:  Negative for shortness of breath.   Cardiovascular:   Negative for chest pain and leg swelling.  Gastrointestinal:  Negative for abdominal pain, blood in stool, constipation, diarrhea, nausea and vomiting.       BM daily   Genitourinary:  Negative for dysuria and hematuria.  Neurological:  Positive for tingling (all her fingers). Negative for headaches.  Psychiatric/Behavioral:  Negative for hallucinations and suicidal ideas.         Objective    BP 118/82   Pulse 70   Temp 97.9 F (36.6 C) (Oral)   Ht 5\' 4"  (1.626 m)   Wt 234 lb 3.2 oz (106.2 kg)   LMP 02/08/2023 (Exact Date)   SpO2 98%   BMI 40.20 kg/m   Physical Exam Vitals and nursing note reviewed.  Constitutional:      Appearance: Normal appearance.  HENT:     Right Ear: Tympanic membrane, ear canal and external ear normal.     Left Ear: Tympanic membrane, ear canal and external ear normal.     Mouth/Throat:     Mouth: Mucous membranes are moist.     Pharynx: Oropharynx is clear.  Eyes:     Extraocular Movements: Extraocular movements intact.     Pupils: Pupils are equal, round, and reactive to light.  Cardiovascular:     Rate and Rhythm: Normal rate and regular rhythm.     Pulses: Normal pulses.     Heart sounds: Normal heart sounds.  Pulmonary:     Effort: Pulmonary effort is normal.     Breath sounds: Normal breath sounds.  Abdominal:     General: Bowel sounds are normal. There is no distension.     Palpations: There is mass.     Tenderness: There is no abdominal tenderness.     Hernia: No hernia is present.    Musculoskeletal:     Right lower leg: No edema.     Left lower leg: No edema.  Lymphadenopathy:     Cervical: No cervical adenopathy.  Skin:    General: Skin is warm.  Neurological:     General: No focal deficit present.     Mental Status: She is alert.     Deep Tendon Reflexes:     Reflex Scores:      Bicep reflexes are 2+ on the right side and 2+ on the left side.      Patellar reflexes are 2+ on the right side and 2+ on the left side.     Comments: Bilateral upper and lower extremity strength 5/5  Psychiatric:        Mood and Affect: Mood normal.        Behavior: Behavior normal.        Thought Content: Thought content normal.  Judgment: Judgment normal.         Assessment & Plan:   Problem List Items Addressed This Visit       Endocrine   Thyroid nodule   History of the same.  Patient had a biopsy per her report came back normal most recent ultrasound shows slightly creased size of nodules.  Will repeat ultrasound for surveillance order placed patient given information to call and set up at her convenience.      Relevant Orders   US THYROID     Other   Vitamin D deficiency   Pending vitamin D levels      Relevant Orders   VITAMIN D 25 Hydroxy (Vit-D Deficiency, Fractures)   Encounter for general adult medical examination with abnormal findings - Primary   Discussed age-appropriate immunizations and screening exams.  Patient is up-to-date with all age-appropriate vaccinations she would like.  She did refuse flu and shingles vaccine.  Did review patient's personal, surgical, social, family histories.  Patient is up-to-date on CRC screening, mammogram, cervical cancer screening.  Patient given information at discharge about preventative healthcare maintenance with anticipatory guidance.      Relevant Orders   CBC   Comprehensive metabolic panel   TSH   Right upper quadrant abdominal mass   Incidental finding on exam.  Ultrasound right upper quadrant placed patient given information to call and schedule at her convenience      Relevant Orders   US Abdomen Limited RUQ (LIVER/GB)   Morbid obesity (HCC)   Patient currently managed at a different clinic on Zepbound 5 mg weekly.  Continue work on healthy lifestyle modifications inclusive of diet and exercise.  Pending TSH, lipid panel, A1c      Relevant Orders   Hemoglobin A1c   Lipid panel   Fatigue   Ambiguous nature check CBC, TSH, vitamin D,  B12.  If all normal consider sleep study      Relevant Orders   Vitamin B12   VITAMIN D 25 Hydroxy (Vit-D Deficiency, Fractures)   Former tobacco use   Pending microscopic urine for microscopic hematuria      Relevant Orders   Urine Microscopic   Other Visit Diagnoses       Encounter for hepatitis C screening test for low risk patient       Relevant Orders   Hepatitis C Antibody     Screening for lipid disorders       Relevant Orders   Lipid panel       Return in about 1 year (around 03/07/2024) for CPE and Labs.   Audria Nine, NP

## 2023-03-08 NOTE — Assessment & Plan Note (Signed)
Patient currently managed at a different clinic on Zepbound 5 mg weekly.  Continue work on healthy lifestyle modifications inclusive of diet and exercise.  Pending TSH, lipid panel, A1c

## 2023-03-09 ENCOUNTER — Encounter: Payer: Self-pay | Admitting: *Deleted

## 2023-03-09 LAB — CBC
Hematocrit: 37.2 % (ref 34.0–46.6)
Hemoglobin: 12.1 g/dL (ref 11.1–15.9)
MCH: 29.4 pg (ref 26.6–33.0)
MCHC: 32.5 g/dL (ref 31.5–35.7)
MCV: 91 fL (ref 79–97)
Platelets: 282 10*3/uL (ref 150–450)
RBC: 4.11 x10E6/uL (ref 3.77–5.28)
RDW: 12.8 % (ref 11.7–15.4)
WBC: 7.1 10*3/uL (ref 3.4–10.8)

## 2023-03-09 LAB — VITAMIN D 25 HYDROXY (VIT D DEFICIENCY, FRACTURES): Vit D, 25-Hydroxy: 29.9 ng/mL — ABNORMAL LOW (ref 30.0–100.0)

## 2023-03-09 LAB — COMPREHENSIVE METABOLIC PANEL
ALT: 22 [IU]/L (ref 0–32)
AST: 19 [IU]/L (ref 0–40)
Albumin: 4.1 g/dL (ref 3.8–4.9)
Alkaline Phosphatase: 108 [IU]/L (ref 44–121)
BUN/Creatinine Ratio: 14 (ref 9–23)
BUN: 14 mg/dL (ref 6–24)
Bilirubin Total: 0.2 mg/dL (ref 0.0–1.2)
CO2: 23 mmol/L (ref 20–29)
Calcium: 8.8 mg/dL (ref 8.7–10.2)
Chloride: 104 mmol/L (ref 96–106)
Creatinine, Ser: 0.97 mg/dL (ref 0.57–1.00)
Globulin, Total: 2.5 g/dL (ref 1.5–4.5)
Glucose: 89 mg/dL (ref 70–99)
Potassium: 4 mmol/L (ref 3.5–5.2)
Sodium: 141 mmol/L (ref 134–144)
Total Protein: 6.6 g/dL (ref 6.0–8.5)
eGFR: 71 mL/min/{1.73_m2} (ref >59)

## 2023-03-09 LAB — HEPATITIS C ANTIBODY: Hep C Virus Ab: NONREACTIVE

## 2023-03-09 LAB — LIPID PANEL
Chol/HDL Ratio: 3.3 {ratio} (ref 0.0–4.4)
Cholesterol, Total: 153 mg/dL (ref 100–199)
HDL: 46 mg/dL (ref >39)
LDL Chol Calc (NIH): 96 mg/dL (ref 0–99)
Triglycerides: 52 mg/dL (ref 0–149)
VLDL Cholesterol Cal: 11 mg/dL (ref 5–40)

## 2023-03-09 LAB — URINALYSIS, MICROSCOPIC ONLY: Casts: NONE SEEN /[LPF]

## 2023-03-09 LAB — HEMOGLOBIN A1C
Est. average glucose Bld gHb Est-mCnc: 108 mg/dL
Hgb A1c MFr Bld: 5.4 % (ref 4.8–5.6)

## 2023-03-09 LAB — VITAMIN B12: Vitamin B-12: 928 pg/mL (ref 232–1245)

## 2023-03-09 LAB — TSH: TSH: 1.76 u[IU]/mL (ref 0.450–4.500)

## 2023-03-09 NOTE — Anesthesia Postprocedure Evaluation (Signed)
Anesthesia Post Note  Patient: Terri Saunders  Procedure(s) Performed: COLONOSCOPY WITH PROPOFOL POLYPECTOMY  Patient location during evaluation: Endoscopy Anesthesia Type: General Level of consciousness: awake and alert Pain management: pain level controlled Vital Signs Assessment: post-procedure vital signs reviewed and stable Respiratory status: spontaneous breathing, nonlabored ventilation, respiratory function stable and patient connected to nasal cannula oxygen Cardiovascular status: blood pressure returned to baseline and stable Postop Assessment: no apparent nausea or vomiting Anesthetic complications: no   No notable events documented.   Last Vitals:  Vitals:   03/02/23 1010 03/02/23 1016  BP: (!) 140/95 (!) 147/88  Pulse: 77 77  Resp:    Temp:    SpO2: 100% 100%    Last Pain:  Vitals:   03/02/23 0953  TempSrc: Temporal  PainSc:                  Lenard Simmer

## 2023-03-10 ENCOUNTER — Encounter: Payer: Self-pay | Admitting: Nurse Practitioner

## 2023-03-10 DIAGNOSIS — R3129 Other microscopic hematuria: Secondary | ICD-10-CM

## 2023-03-17 ENCOUNTER — Other Ambulatory Visit: Payer: Self-pay | Admitting: Nurse Practitioner

## 2023-03-17 DIAGNOSIS — E041 Nontoxic single thyroid nodule: Secondary | ICD-10-CM

## 2023-03-17 DIAGNOSIS — R5383 Other fatigue: Secondary | ICD-10-CM

## 2023-03-17 DIAGNOSIS — R1901 Right upper quadrant abdominal swelling, mass and lump: Secondary | ICD-10-CM

## 2023-03-17 DIAGNOSIS — Z1159 Encounter for screening for other viral diseases: Secondary | ICD-10-CM

## 2023-03-17 DIAGNOSIS — Z0001 Encounter for general adult medical examination with abnormal findings: Secondary | ICD-10-CM

## 2023-03-17 DIAGNOSIS — R1900 Intra-abdominal and pelvic swelling, mass and lump, unspecified site: Secondary | ICD-10-CM

## 2023-03-17 DIAGNOSIS — E559 Vitamin D deficiency, unspecified: Secondary | ICD-10-CM

## 2023-03-17 DIAGNOSIS — Z87891 Personal history of nicotine dependence: Secondary | ICD-10-CM

## 2023-03-17 DIAGNOSIS — Z13220 Encounter for screening for lipoid disorders: Secondary | ICD-10-CM

## 2023-03-20 ENCOUNTER — Ambulatory Visit: Payer: Managed Care, Other (non HMO)

## 2023-03-20 ENCOUNTER — Ambulatory Visit: Admission: RE | Admit: 2023-03-20 | Payer: Managed Care, Other (non HMO) | Source: Ambulatory Visit

## 2023-03-23 ENCOUNTER — Ambulatory Visit
Admission: RE | Admit: 2023-03-23 | Discharge: 2023-03-23 | Disposition: A | Discharge status: Home / Self Care | Payer: BC Managed Care – PPO | Source: Ambulatory Visit | Attending: Nurse Practitioner | Admitting: Nurse Practitioner

## 2023-03-23 DIAGNOSIS — R1900 Intra-abdominal and pelvic swelling, mass and lump, unspecified site: Secondary | ICD-10-CM | POA: Insufficient documentation

## 2023-03-23 DIAGNOSIS — R1901 Right upper quadrant abdominal swelling, mass and lump: Secondary | ICD-10-CM | POA: Diagnosis present

## 2023-03-23 DIAGNOSIS — E041 Nontoxic single thyroid nodule: Secondary | ICD-10-CM | POA: Insufficient documentation

## 2023-04-27 ENCOUNTER — Ambulatory Visit: Payer: Managed Care, Other (non HMO) | Admitting: Urology

## 2023-05-25 ENCOUNTER — Ambulatory Visit: Payer: Managed Care, Other (non HMO) | Admitting: Urology

## 2023-05-25 ENCOUNTER — Encounter: Payer: Self-pay | Admitting: Urology

## 2023-05-25 VITALS — BP 148/89 | HR 80 | Ht 64.0 in | Wt 240.0 lb

## 2023-05-25 DIAGNOSIS — R3129 Other microscopic hematuria: Secondary | ICD-10-CM | POA: Diagnosis not present

## 2023-05-25 DIAGNOSIS — R310 Gross hematuria: Secondary | ICD-10-CM

## 2023-05-25 LAB — URINALYSIS, COMPLETE
Bilirubin, UA: NEGATIVE
Glucose, UA: NEGATIVE
Ketones, UA: NEGATIVE
Leukocytes,UA: NEGATIVE
Nitrite, UA: NEGATIVE
Protein,UA: NEGATIVE
RBC, UA: NEGATIVE
Specific Gravity, UA: 1.025 (ref 1.005–1.030)
Urobilinogen, Ur: 0.2 mg/dL (ref 0.2–1.0)
pH, UA: 5.5 (ref 5.0–7.5)

## 2023-05-25 LAB — MICROSCOPIC EXAMINATION

## 2023-05-25 NOTE — Progress Notes (Signed)
 I, Terri Saunders, acting as a scribe for Terri Altes, MD., have documented all relevant documentation on the behalf of Terri Altes, MD, as directed by Terri Altes, MD while in the presence of Terri Altes, MD.  05/25/2023 3:38 PM   Hazle Quant 03-01-1971 [id number]  Referring provider: Eden Emms, NP [address] [place],  [place] [address]  Chief Complaint  Patient presents with   Hematuria    HPI: Terri Saunders is a 53 y.o. female referred for evaluation of microhumaturia,.  Routine urinalysis performed during PCP visit 03/08/23 showed 3-10 RBCs per high-power field on microscopy, along with calcium oxalate crystals.  She denies gross hematuria.  No bothersome lower urinary tract symptoms.  No previous history of stone disease.  Prior tobacco history, she smoked off and on x8 years, average one-half pack per day, and quit 10 years ago.  Estimate she has 1-2 UTIs per year. No relation of symptom onset to intercourse. On Epic review there are no urine cultures ordered.   PMH: Past Medical History:  Diagnosis Date   Bursitis    Ruptured disk     Surgical History: Past Surgical History:  Procedure Laterality Date   COLONOSCOPY WITH PROPOFOL N/A 03/02/2023   Procedure: COLONOSCOPY WITH PROPOFOL;  Surgeon: Midge Minium, MD;  Location: [facility] ENDOSCOPY;  Service: Endoscopy;  Laterality: N/A;   DILATION AND CURETTAGE OF UTERUS  2003   following a miscarriage   LAPAROSCOPY  2004   diagnostic for infertility work up   POLYPECTOMY  03/02/2023   Procedure: POLYPECTOMY;  Surgeon: Midge Minium, MD;  Location: [facility] ENDOSCOPY;  Service: Endoscopy;;    Home Medications:  Allergies as of 05/25/2023       Reactions   Sulfa Antibiotics Hives        Medication List        Accurate as of May 25, 2023  3:38 PM. If you have any questions, ask your nurse or doctor.          B-12 Compliance Injection 1000 MCG/ML Kit Generic drug:  Cyanocobalamin Inject 1,000 mcg as directed once a week.   Loratadine 10 MG Caps Take by oral route.   Lysine 1000 MG Tabs Take by oral route.   testosterone cypionate 200 MG/ML injection Commonly known as: DEPOTESTOSTERONE CYPIONATE Inject 100 mg into the muscle every 14 (fourteen) days.   tirzepatide 5 MG/0.5ML Pen Commonly known as: ZEPBOUND Inject 5 mg into the skin once a week.        Allergies:  Allergies  Allergen Reactions   Sulfa Antibiotics Hives    Family History: Family History  Problem Relation Age of Onset   Cancer Mother        ovarain   Hyperlipidemia Mother    Atrial fibrillation Mother    Hyperlipidemia Father    Irritable bowel syndrome Brother    Colon cancer Maternal Grandmother    Heart disease Maternal Grandfather    Colon cancer Maternal Grandfather    Coronary artery disease Paternal Grandmother    Hypertension Paternal Grandmother    Heart attack Paternal Grandfather     Social History:  reports that she has quit smoking. Her smoking use included cigarettes. She started smoking about 37 years ago. She has a 8.5 pack-year smoking history. She has never used smokeless tobacco. She reports current alcohol use. She reports that she does not use drugs.   Physical Exam: BP (!) 148/89  Pulse 80   Ht 5\' 4"  (1.626 m)   Wt 240 lb (108.9 kg)   BMI 41.20 kg/m   Constitutional:  Alert and oriented, No acute distress. HEENT: [place] AT Respiratory: Normal respiratory effort, no increased work of breathing. Psychiatric: Normal mood and affect.  Urinalysis Dipstick negative, microscopy 6-10 WBC.   Assessment & Plan:    1. Microhematuria AUA hematuria risk stratification: intermediate based on age.  We discussed the recommended evaluation of intermediate risk hematuria, which includes renal ultrasound and cystoscopy.  Since her urinalysis did show calcium moxate crystals, I have recommended a non-contrast CT abdomen pelvis in lieu of renal  ultrasound. Order was placed and she will follow up for CT review and cystoscopy.  Cystoscopy was discussed.  I have reviewed the above documentation for accuracy and completeness, and I agree with the above.   Terri Altes, MD  [facility] [address], [address] [place], [place] [address] [phone]

## 2023-06-29 ENCOUNTER — Other Ambulatory Visit: Admitting: Urology

## 2023-07-06 ENCOUNTER — Ambulatory Visit
Admission: RE | Admit: 2023-07-06 | Discharge: 2023-07-06 | Disposition: A | Discharge status: Home / Self Care | Source: Ambulatory Visit | Attending: Urology | Admitting: Urology

## 2023-07-06 DIAGNOSIS — R3129 Other microscopic hematuria: Secondary | ICD-10-CM | POA: Insufficient documentation

## 2023-07-18 ENCOUNTER — Encounter: Payer: Self-pay | Admitting: *Deleted

## 2023-08-03 ENCOUNTER — Ambulatory Visit: Admitting: Urology

## 2023-08-03 VITALS — BP 147/100 | HR 76 | Ht 64.0 in | Wt 240.0 lb

## 2023-08-03 DIAGNOSIS — R3129 Other microscopic hematuria: Secondary | ICD-10-CM

## 2023-08-03 LAB — URINALYSIS, COMPLETE
Bilirubin, UA: NEGATIVE
Glucose, UA: NEGATIVE
Ketones, UA: NEGATIVE
Leukocytes,UA: NEGATIVE
Nitrite, UA: NEGATIVE
Protein,UA: NEGATIVE
RBC, UA: NEGATIVE
Specific Gravity, UA: 1.025 (ref 1.005–1.030)
Urobilinogen, Ur: 0.2 mg/dL (ref 0.2–1.0)
pH, UA: 6 (ref 5.0–7.5)

## 2023-08-03 LAB — MICROSCOPIC EXAMINATION: Epithelial Cells (non renal): >10 /HPF — AB (ref 0–10)

## 2023-08-03 NOTE — Progress Notes (Signed)
   08/03/23  CC:  Chief Complaint  Patient presents with   Cysto    HPI: Refer to my previous note 05/25/2023.  CT renal stone study showed no urinary tract calculi or other abnormalities.  UA today negative  Blood pressure (!) 147/100, pulse 76, height 5\' 4"  (1.626 m), weight 240 lb (108.9 kg). NED. A&Ox3.   No respiratory distress   Abd soft, NT, ND Normal external genitalia with patent urethral meatus  Cystoscopy Procedure Note  Patient identification was confirmed, informed consent was obtained, and patient was prepped using Betadine solution.  Lidocaine  jelly was administered per urethral meatus.    Procedure: - Flexible cystoscope introduced, without any difficulty.   - Thorough search of the bladder revealed:    normal urethral meatus    normal urothelium    no stones    no ulcers     no tumors    no urethral polyps    no trabeculation  - Ureteral orifices were normal in position and appearance.  Post-Procedure: - Patient tolerated the procedure well  Assessment/ Plan: Repeat UA with microscopy 6-12 months which can be performed by PCP.  Reassess for increasing level of RBCs    Geraline Knapp, MD

## 2024-01-30 ENCOUNTER — Ambulatory Visit (INDEPENDENT_AMBULATORY_CARE_PROVIDER_SITE_OTHER)

## 2024-01-30 DIAGNOSIS — D229 Melanocytic nevi, unspecified: Secondary | ICD-10-CM

## 2024-01-30 DIAGNOSIS — L821 Other seborrheic keratosis: Secondary | ICD-10-CM | POA: Diagnosis not present

## 2024-01-30 DIAGNOSIS — D492 Neoplasm of unspecified behavior of bone, soft tissue, and skin: Secondary | ICD-10-CM

## 2024-01-30 DIAGNOSIS — D1801 Hemangioma of skin and subcutaneous tissue: Secondary | ICD-10-CM

## 2024-01-30 DIAGNOSIS — L814 Other melanin hyperpigmentation: Secondary | ICD-10-CM | POA: Diagnosis not present

## 2024-01-30 DIAGNOSIS — C4490 Unspecified malignant neoplasm of skin, unspecified: Secondary | ICD-10-CM

## 2024-01-30 DIAGNOSIS — W908XXA Exposure to other nonionizing radiation, initial encounter: Secondary | ICD-10-CM

## 2024-01-30 DIAGNOSIS — L578 Other skin changes due to chronic exposure to nonionizing radiation: Secondary | ICD-10-CM

## 2024-01-30 DIAGNOSIS — Z1283 Encounter for screening for malignant neoplasm of skin: Secondary | ICD-10-CM | POA: Diagnosis not present

## 2024-01-30 DIAGNOSIS — Z85828 Personal history of other malignant neoplasm of skin: Secondary | ICD-10-CM

## 2024-01-30 NOTE — Progress Notes (Signed)
 Subjective   Terri Saunders is a 53 y.o. female who presents for the following: Total body skin exam for skin cancer screening and mole check. The patient has spots, moles and lesions to be evaluated, some may be new or changing and the patient may have concern these could be cancer.. Patient is new patient  Today patient reports: Hx of skin cancer. Unsure of type.   Review of Systems:    No other skin or systemic complaints except as noted in HPI or Assessment and Plan.  The following portions of the chart were reviewed this encounter and updated as appropriate: medications, allergies, medical history  Relevant Medical History:  Personal history of non melanoma skin cancer - see medical history for full details   Objective  (SKPE) Well appearing patient in no apparent distress; mood and affect are within normal limits. Examination was performed of the: Full Skin Examination: scalp, head, eyes, ears, nose, lips, neck, chest, axillae, abdomen, back, buttocks, bilateral upper extremities, bilateral lower extremities, hands, feet, fingers, toes, fingernails, and toenails.   Examination notable for: Angioma(s): Scattered red vascular papule(s)  , Lentigo/lentigines: Scattered pigmented macules that are tan to brown in color and are somewhat non-uniform in shape and concentrated in the sun-exposed areas, Nevus/nevi: Scattered well-demarcated, regular, pigmented macule(s) and/or papule(s)  , Seborrheic Keratosis(es): Stuck-on appearing keratotic papule(s) on the trunk, some  irritated with redness, crusting, edema, and/or partial avulsion, Actinic Damage/Elastosis: chronic sun damage: dyspigmentation, telangiectasia, and wrinkling  Examination limited by: Undergarments   Left Abdomen 6 mm brown macule   Assessment & Plan  (SKAP)   SKIN CANCER SCREENING PERFORMED TODAY.  BENIGN SKIN FINDINGS  - Lentigines  - Seborrheic keratoses  - Hemangiomas   - Nevus/Multiple Benign Nevi  -  Reassurance provided regarding the benign appearance of lesions noted on exam today; no treatment is indicated in the absence of symptoms/changes. - Reinforced importance of photoprotective strategies including liberal and frequent sunscreen use of a broad-spectrum SPF 30 or greater, use of protective clothing, and sun avoidance for prevention of cutaneous malignancy and photoaging.  Counseled patient on the importance of regular self-skin monitoring as well as routine clinical skin examinations as scheduled.   ACTINIC DAMAGE - Chronic condition, secondary to cumulative UV/sun exposure - Recommend daily broad spectrum sunscreen SPF 30+ to sun-exposed areas, reapply every 2 hours as needed.  - Staying in the shade or wearing long sleeves, sun glasses (UVA+UVB protection) and wide brim hats (4-inch brim around the entire circumference of the hat) are also recommended for sun protection.  - Call for new or changing lesions.  Personal history of non melanoma skin cancer  - Reviewed medical history for full details  - Reviewed sun protective measures as above - Encouraged full body skin exams     Level of service outlined above   Patient instructions (SKPI)   Procedures, orders, diagnosis for this visit:  NEOPLASM OF SKIN Left Abdomen Skin / nail biopsy Type of biopsy: tangential   Informed consent: discussed and consent obtained   Timeout: patient name, date of birth, surgical site, and procedure verified   Procedure prep:  Patient was prepped and draped in usual sterile fashion Prep type:  Isopropyl alcohol Anesthesia: the lesion was anesthetized in a standard fashion   Anesthetic:  1% lidocaine  w/ epinephrine 1-100,000 buffered w/ 8.4% NaHCO3 Instrument used: DermaBlade   Hemostasis achieved with: pressure and aluminum chloride   Outcome: patient tolerated procedure well   Post-procedure details:  sterile dressing applied and wound care instructions given   Dressing type: bandage and  petrolatum    Anatomic Pathology Report  Neoplasm of skin -     Skin / nail biopsy -     Anatomic Pathology Report    Return to clinic: Return in about 1 year (around 01/29/2025) for TBSE.  I, Kate Fought, CMA, am acting as scribe for Lauraine JAYSON Kanaris, MD.   Documentation: I have reviewed the above documentation for accuracy and completeness, and I agree with the above.  Lauraine JAYSON Kanaris, MD

## 2024-01-30 NOTE — Patient Instructions (Addendum)
 The bandage should remain in place until tomorrow. Remove the bandages and clean the wound once daily as follows:  - Wash your hands. Clean the wound gently with soap and water, and then pat dry. Do not rub. Apply a small amount of Petroleum Jelly or Vaseline. Cover the area with a Band-Aid.  A small amount of bleeding is normal. If bleeding persists, apply firm pressure over the bandage for 5 to 10 minutes without interruption. If bleeding continues, call our office. Continue to clean the area as directed above until the wound is healed. Shave biopsies may take several weeks to heal. It is normal if the edges are pink/red and the center is slight yellowish or white in color. However if the site becomes hot, swollen, has a thick drainage or redness that expands away from the site please call us . We will contact you with results once available.     Skin Care and Sun Protection  Your skin plays an important role in keeping the entire body healthy. Below are some tips on how to try and maximize skin health from the outside in.  Bathing  Bathe in mildly warm water every 1 to 2 days, followed by light drying and an application of a thick moisturizer cream or ointment, preferably one that comes in a tub.  Recommended body soaps/washes: - Cerave Hydrating Cleanser Bar - Dove Sensitive Skin Fragrance Free Beauty Bar - Aveeno Active Naturals Skin Relief Body Wash, Fragrance Free - Free & Clear (vanicream) liquid cleanser  Moisturizer  Body moisturizer: Apply a moisturizer throughout the day and after bathing.  When you moisturize after bathing, this locks in the moisture.  This can lead to softer and smoother skin.  Body moisturizers come in ointments, creams, and lotions.  If you have dry skin, we recommend the use of ointments or creams rather than lotions.  In other words, something you scoop out of a jar rather than squirted out.  Ointments and creams are thicker and thus provide better  moisturization.    Recommended creams for all over: - Vanicream cream - CeraVe Moisturizing Cream - Eucerin Original Healing Soothing Repair Cream  Recommended ointments: greasy, but do the best job at moisturization - Plain Vaseline (petroleum jelly) - CeraVe Healing ointment - Aquaphor Healing ointment  Face moisturizers: For your face, look for something that is labeled as non-comedogenic (won't clog pores) and oil-free. Your moisturizer for the day should have SPF 30 or higher in it as well, but your moisturizer for night can be without SPF. Some good examples are: - CeraVe Moisturizing Cream (can be used as a face moisturizer) - La Roche-Posay Toleriane Double Repair Facial Moisturizer with SPF 30 (my favorite for day time) - CeraVe AM (has SPF 30) - CeraVe PM  Sunscreen  Who needs sunscreen? Everyone. Sunscreen use can help prevent skin cancer by protecting you from the sun's harmful ultraviolet rays. Anyone can get skin cancer, regardless of age, gender or race. In fact, it is estimated that one in five Americans will develop skin cancer in their lifetime.  Sunscreen alone cannot fully protect you. In addition to wearing sunscreen, dermatologists recommend taking the following steps to protect your skin and find skin cancer early:  Seek shade when appropriate, remembering that the sun's rays are strongest between 10 a.m. and 2 p.m. If your shadow is shorter than you are, seek shade. Dress to protect yourself from the sun by wearing a lightweight long-sleeved shirt, pants, a wide-brimmed hat and sunglasses,  when possible.  Use extra caution near water, snow and sand as they reflect the damaging rays of the sun, which can increase your chance of sunburn.  Get vitamin D  safely through a healthy diet that may include vitamin supplements. Don't seek the sun. Avoid tanning beds. Ultraviolet light from the sun and tanning beds can cause skin cancer and wrinkling. If you want to look tan,  you may wish to use a self-tanning product, but continue to use sunscreen with it.  When should I use sunscreen? Every day you go outside--even if you're just walking to and from your form of transportation. The sun emits harmful UV rays year-round. Even on cloudy days, up to 80 percent of the sun's harmful UV rays can penetrate your skin. Snow, sand and water increase the need for sunscreen because they reflect the sun's rays.  How much sunscreen should I use, and how often should I apply it? Most people only apply 25-50 percent of the recommended amount of sunscreen. Apply enough sunscreen to cover all exposed skin. Most adults need about 1 ounce -- or enough to fill a shot glass -- to fully cover their body.  Don't forget to apply to the tops of your feet, your neck, your ears and the top of your head. Apply sunscreen to dry skin 15 minutes before going outdoors.  Skin cancer also can form on the lips. To protect your lips, apply a lip balm or lipstick that contains sunscreen with an SPF of 30 or higher.  When outdoors, reapply sunscreen approximately every two hours, or after swimming or sweating, according to the directions on the bottle.   Broad-spectrum sunscreens protect against both UVA and UVB rays. What is the difference between the rays? Sunlight consists of two types of harmful rays that reach the earth -- UVA rays and UVB rays. Overexposure to either can lead to skin cancer. In addition to causing skin cancer, here's what each of these rays do:  UVA rays (or aging rays) can prematurely age your skin, causing wrinkles and age spots, and can pass through window glass. UVB rays (or burning rays) are the primary cause of sunburn and are blocked by window glass  There is no safe way to tan. Every time you tan, you damage your skin. As this damage builds, you speed up the aging of your skin and increase your risk for all types of skin cancer.  What is the difference between chemical and  physical sunscreens? Chemical sunscreens work like a sponge, absorbing the sun's rays. They contain one or more of the following active ingredients: oxybenzone, avobenzone, octisalate, octocrylene, homosalate and octinoxate. These formulations tend to be easier to rub into the skin without leaving a white residue.   Physical sunscreens work like a shield, sitting sit on the surface of your skin and deflecting the sun's rays. They contain the active ingredients zinc oxide and/or titanium dioxide. Use this sunscreen if you have sensitive skin.   What type of sunscreen should I use? The best type of sunscreen is the one you will use again and again. Just make sure it offers broad-spectrum (UVA and UVB) protection, has an SPF of 30+, and is water-resistant. The kind of sunscreen you use is a matter of personal choice, and may vary depending on the area of the body to be protected. Available sunscreen options include lotions, creams, gels, ointments, wax sticks and sprays.  Recommended physical sunscreens for face: - Neutrogena Sheer Zinc - Aveeno Positively Mineral  Sensitive - CeraVe Hydrating Mineral (also has a tinted version) - La Roche-Posay Anthelios Mineral Face (comes as a cream, lotion, light fluid, and there is also a tinted version).  - EltaMD UV Clear (also has a tinted version)  Recommended physical sunscreens for body: - Neutrogena Sheer Zinc Dry-Touch Sunscreen Sensitive Skin Lotion Broad Spectrum SPF 50 - Aveeno Positively Mineral Sensitive Skin Sunscreen Broad Spectrum SPF 50 - La Roche-Posay Anthelios SPF 50 Mineral Sunscreen - Gentle Lotion - CeraVe Hydrating Mineral Sunscreen SPF 50  Recommended chemical sunscreens for face: - Anthelios UV Correct Face Sunscreen SPF 70 with Niacinamide - Neutrogena Clear Face Oil-Free SPF 50 with Helioplex - Neutrogena Sport Face Oil-Free SPF 70+ with Helioplex - Aveeno Protect + Hydrate Sunscreen For Face SPF 70 - La Roche-Posay Anthelios  Light Fluid Sunscreen for Face SPF 60  Recommended chemical sunscreens for body: - Neutrogena Ultra Sheer Dry-Touch Sunscreen SPF 70 - Aveeno Protect + Hydrate Broad Spectrum All-Day Hydration SPF 60 (comes in a big pump) - La Roche-Posay Anthelios Melt-In Milk Sunscreen SPF 60  Recommended UPF Clothing - Coolibar  - Solbari  - Wallaroo hats  - Materials Engineer (On Amazon)    Due to recent changes in healthcare laws, you may see results of your pathology and/or laboratory studies on MyChart before the doctors have had a chance to review them. We understand that in some cases there may be results that are confusing or concerning to you. Please understand that not all results are received at the same time and often the doctors may need to interpret multiple results in order to provide you with the best plan of care or course of treatment. Therefore, we ask that you please give us  2 business days to thoroughly review all your results before contacting the office for clarification. Should we see a critical lab result, you will be contacted sooner.   If You Need Anything After Your Visit  If you have any questions or concerns for your doctor, please call our main line at [phone] and press option 4 to reach your doctor's medical assistant. If no one answers, please leave a voicemail as directed and we will return your call as soon as possible. Messages left after 4 pm will be answered the following business day.   You may also send us  a message via MyChart. We typically respond to MyChart messages within 1-2 business days.  For prescription refills, please ask your pharmacy to contact our office. Our fax number is [phone].  If you have an urgent issue when the clinic is closed that cannot wait until the next business day, you can page your doctor at the number below.    Please note that while we do our best to be available for urgent issues outside of office hours, we are not available 24/7.    If you have an urgent issue and are unable to reach us , you may choose to seek medical care at your doctor's office, retail clinic, urgent care center, or emergency room.  If you have a medical emergency, please immediately call 911 or go to the emergency department.  Pager Numbers  - Dr. Hester: [phone]  - Dr. Jackquline: [phone]  - Dr. Claudene: [phone]   - Dr. Raymund: [phone]  In the event of inclement weather, please call our main line at [phone] for an update on the status of any delays or closures.  Dermatology Medication Tips: Please keep the boxes that topical medications come in in order to  help keep track of the instructions about where and how to use these. Pharmacies typically print the medication instructions only on the boxes and not directly on the medication tubes.   If your medication is too expensive, please contact our office at [phone] option 4 or send us  a message through MyChart.   We are unable to tell what your co-pay for medications will be in advance as this is different depending on your insurance coverage. However, we may be able to find a substitute medication at lower cost or fill out paperwork to get insurance to cover a needed medication.   If a prior authorization is required to get your medication covered by your insurance company, please allow us  1-2 business days to complete this process.  Drug prices often vary depending on where the prescription is filled and some pharmacies may offer cheaper prices.  The website www.goodrx.com contains coupons for medications through different pharmacies. The prices here do not account for what the cost may be with help from insurance (it may be cheaper with your insurance), but the website can give you the price if you did not use any insurance.  - You can print the associated coupon and take it with your prescription to the pharmacy.  - You may also stop by our office during regular  business hours and pick up a GoodRx coupon card.  - If you need your prescription sent electronically to a different pharmacy, notify our office through [facility] or by phone at [phone] option 4.     Si Usted Necesita Algo Despus de Su Visita  Tambin puede enviarnos un mensaje a travs de Clinical Cytogeneticist. Por lo general respondemos a los mensajes de MyChart en el transcurso de 1 a 2 das hbiles.  Para renovar recetas, por favor pida a su farmacia que se ponga en contacto con nuestra oficina. Randi lakes de fax es [place] [phone].  Si tiene un asunto urgente cuando la clnica est cerrada y que no puede esperar hasta el siguiente da hbil, puede llamar/localizar a su doctor(a) al nmero que aparece a continuacin.   Por favor, tenga en cuenta que aunque hacemos todo lo posible para estar disponibles para asuntos urgentes fuera del horario de [place], no estamos disponibles las 24 horas del da, los 7 [address]  [phone] de la [place].   Si tiene un problema urgente y no puede comunicarse con nosotros, puede optar por buscar atencin mdica  en el consultorio de su doctor(a), en una clnica privada, en un centro de atencin urgente o en una sala de emergencias.  Si tiene engineer, drilling, por favor llame inmediatamente al 911 o vaya a la sala de emergencias.  Nmeros de bper  - Dr. Hester: [phone]  - Dra. Jackquline: [phone]  - Dr. Claudene: [phone]  - Dra. Kitts: [phone]  En caso de inclemencias del [place], por favor llame a nuestra lnea principal al [phone] para una actualizacin sobre el estado de cualquier retraso o cierre.  Consejos para la medicacin en dermatologa: Por favor, guarde las cajas en las que vienen los medicamentos de uso tpico para ayudarle a seguir las instrucciones sobre dnde y cmo usarlos. Las farmacias generalmente imprimen las instrucciones del medicamento slo en las cajas y no directamente en los tubos del [place].   Si su  medicamento es muy caro, por favor, pngase en contacto con landry rieger llamando al [phone] y presione la opcin 4 o envenos un mensaje a travs de Clinical Cytogeneticist.   No podemos decirle  cul ser su copago por los medicamentos por adelantado ya que esto es diferente dependiendo de la cobertura de su seguro. Sin embargo, es posible que podamos encontrar un medicamento sustituto a audiological scientist un formulario para que el seguro cubra el medicamento que se considera necesario.   Si se requiere una autorizacin previa para que su compaa de seguros cubra su medicamento, por favor permtanos de 1 a 2 das hbiles para completar este proceso.  Los precios de los medicamentos varan con frecuencia dependiendo del environmental consultant de dnde se surte la receta y alguna farmacias pueden ofrecer precios ms baratos.  El sitio web www.goodrx.com tiene cupones para medicamentos de health and safety inspector. Los precios aqu no tienen en cuenta lo que podra costar con la ayuda del seguro (puede ser ms barato con su seguro), pero el sitio web puede darle el precio si no utiliz tourist information centre manager.  - Puede imprimir el cupn correspondiente y llevarlo con su receta a la farmacia.  - Tambin puede pasar por nuestra oficina durante el horario de atencin regular y education officer, museum una tarjeta de cupones de GoodRx.  - Si necesita que su receta se enve electrnicamente a una farmacia diferente, informe a nuestra oficina a travs de MyChart de [place] o por telfono llamando al [phone] y presione la opcin 4.

## 2024-02-06 ENCOUNTER — Ambulatory Visit: Payer: Self-pay

## 2024-02-06 LAB — ANATOMIC PATHOLOGY REPORT

## 2024-02-06 NOTE — Telephone Encounter (Signed)
-----   Message from Lauraine JAYSON Kanaris sent at 02/06/2024 10:02 AM EST ----- Diagnosis: Comment  Comment: Part 1-MACULAR SEBORRHEIC KERATOSIS. SEE COMMENTS.   Please notify patient with below plan: Benign, observe.   ----- Message ----- From: Interface, [facility] Lab Results In Sent: 02/06/2024   7:37 AM EST To: Lauraine JAYSON Kanaris, MD

## 2024-02-06 NOTE — Telephone Encounter (Signed)
 Patient informed of pathology results

## 2024-03-11 ENCOUNTER — Encounter: Payer: Managed Care, Other (non HMO) | Admitting: Nurse Practitioner

## 2024-04-04 ENCOUNTER — Ambulatory Visit: Payer: Self-pay | Admitting: Nurse Practitioner

## 2024-04-04 ENCOUNTER — Encounter: Payer: Self-pay | Admitting: Nurse Practitioner

## 2024-04-04 VITALS — BP 128/86 | HR 80 | Temp 97.8°F | Ht 64.0 in | Wt 237.6 lb

## 2024-04-04 DIAGNOSIS — R5383 Other fatigue: Secondary | ICD-10-CM | POA: Diagnosis not present

## 2024-04-04 DIAGNOSIS — Z131 Encounter for screening for diabetes mellitus: Secondary | ICD-10-CM | POA: Diagnosis not present

## 2024-04-04 DIAGNOSIS — E559 Vitamin D deficiency, unspecified: Secondary | ICD-10-CM | POA: Diagnosis not present

## 2024-04-04 DIAGNOSIS — E041 Nontoxic single thyroid nodule: Secondary | ICD-10-CM

## 2024-04-04 DIAGNOSIS — Z0000 Encounter for general adult medical examination without abnormal findings: Secondary | ICD-10-CM | POA: Diagnosis not present

## 2024-04-04 DIAGNOSIS — G478 Other sleep disorders: Secondary | ICD-10-CM | POA: Insufficient documentation

## 2024-04-04 DIAGNOSIS — Z126 Encounter for screening for malignant neoplasm of bladder: Secondary | ICD-10-CM | POA: Diagnosis not present

## 2024-04-04 DIAGNOSIS — R0683 Snoring: Secondary | ICD-10-CM | POA: Diagnosis not present

## 2024-04-04 DIAGNOSIS — Z13220 Encounter for screening for lipoid disorders: Secondary | ICD-10-CM | POA: Diagnosis not present

## 2024-04-04 NOTE — Assessment & Plan Note (Signed)
 History of same reviewed most recent ultrasound.  No further follow-up needed since patient had FNA came back negative

## 2024-04-04 NOTE — Progress Notes (Signed)
 "  Established Patient Office Visit  Subjective   Patient ID: Terri Saunders, female    DOB: 04/28/1970  Age: 54 y.o. MRN: [id number]  Chief Complaint  Patient presents with   Annual Exam    HPI  Allergies: Currently maintained on loratadine 10 mg daily  Thyroid  nodule: History of the same repeat ultrasound showed minimal growth.  Per patient report she had FNA that was negative  for complete physical and follow up of chronic conditions.  Immunizations: -Tetanus: Completed in 2018 -Influenza: refused  -Shingles: Discussed in office -Pneumonia discussed in office  Diet: Fair diet. She is eating 1-3 meals. She will not snack. She does coffee and Diet Dr. Nunzio Exercise: No regular exercise. Walking sometimes  Eye exam: Completes annually, glasses  Dental exam: Completes semi-annually    Colonoscopy: Completed in 05/02/2022, repeat 5 years.  Patient due 2029 Lung Cancer Screening: Former smoker  Pap smear: 05/05/2016?  [facility]  Mammogram: 02/13/2024 at [facility] with Dr. Marget  DEXA: Too young  Sleep: going to bed around 11 and will get up around 7-730. Does not feel rested. Does snore     04/04/2024   11:11 AM  Results of the Epworth flowsheet  Sitting and reading 1  Watching TV 1  Sitting, inactive in a public place (e.g. a theatre or a meeting) 0  As a passenger in a car for an hour without a break 2  Lying down to rest in the afternoon when circumstances permit 1  Sitting and talking to someone 0  Sitting quietly after a lunch without alcohol 1  In a car, while stopped for a few minutes in traffic 0  Total score 6      Review of Systems  Constitutional:  Negative for chills and fever.  Respiratory:  Negative for shortness of breath.   Cardiovascular:  Negative for chest pain and leg swelling.  Gastrointestinal:  Negative for abdominal pain, blood in stool, constipation, diarrhea, nausea and vomiting.       BM daily    Genitourinary:  Negative for dysuria and hematuria.  Neurological:  Negative for dizziness, tingling and headaches.  Psychiatric/Behavioral:  Negative for hallucinations and suicidal ideas.       Objective:     BP 128/86 (BP Location: Left Arm, Patient Position: Sitting, Cuff Size: Large)   Pulse 80   Temp 97.8 F (36.6 C) (Temporal)   Ht 5' 4 (1.626 m)   Wt 237 lb 9.6 oz (107.8 kg)   LMP 03/26/2024   SpO2 97%   BMI 40.78 kg/m  BP Readings from Last 3 Encounters:  04/04/24 128/86  08/03/23 (!) 147/100  05/25/23 (!) 148/89   Wt Readings from Last 3 Encounters:  04/04/24 237 lb 9.6 oz (107.8 kg)  08/03/23 240 lb (108.9 kg)  05/25/23 240 lb (108.9 kg)   SpO2 Readings from Last 3 Encounters:  04/04/24 97%  03/08/23 98%  03/02/23 100%    Physical Exam Vitals and nursing note reviewed.  Constitutional:      Appearance: Normal appearance. She is obese.  HENT:     Right Ear: Tympanic membrane, ear canal and external ear normal.     Left Ear: Tympanic membrane, ear canal and external ear normal.     Mouth/Throat:     Mouth: Mucous membranes are moist.     Pharynx: Oropharynx is clear.  Eyes:     Extraocular Movements: Extraocular movements intact.     Pupils:  Pupils are equal, round, and reactive to light.  Cardiovascular:     Rate and Rhythm: Normal rate and regular rhythm.     Pulses: Normal pulses.     Heart sounds: Normal heart sounds.  Pulmonary:     Effort: Pulmonary effort is normal.     Breath sounds: Normal breath sounds.  Abdominal:     General: Bowel sounds are normal. There is no distension.     Palpations: There is no mass.     Tenderness: There is no abdominal tenderness.     Hernia: No hernia is present.  Musculoskeletal:     Right lower leg: No edema.     Left lower leg: No edema.  Lymphadenopathy:     Cervical: No cervical adenopathy.  Skin:    General: Skin is warm.  Neurological:     General: No focal deficit present.     Mental  Status: She is alert.     Deep Tendon Reflexes:     Reflex Scores:      Bicep reflexes are 2+ on the right side and 2+ on the left side.      Patellar reflexes are 2+ on the right side and 2+ on the left side.    Comments: Bilateral upper and lower extremity strength 5/5  Psychiatric:        Mood and Affect: Mood normal.        Behavior: Behavior normal.        Thought Content: Thought content normal.        Judgment: Judgment normal.      No results found for any visits on 04/04/24.    The 10-year ASCVD risk score (Arnett DK, et al., 2019) is: 1.3%    Assessment & Plan:   Problem List Items Addressed This Visit       Endocrine   Thyroid  nodule   History of same reviewed most recent ultrasound.  No further follow-up needed since patient had FNA came back negative      Relevant Orders   TSH     Nervous and Auditory   Non-restorative sleep   Epworth Sleepiness Scale in office.  Pending labs consider sleep study given morbid obesity        Other   Vitamin D  deficiency   History of the same pending vitamin D  level today      Relevant Orders   VITAMIN D  25 Hydroxy (Vit-D Deficiency, Fractures)   Preventative health care - Primary   Discussed age-appropriate immunizations and screening exams.  Did review patient's personal, surgical, social, family history.  Patient up-to-date with all age-appropriate vaccinations she would like.  Did discuss pneumonia and shingles vaccine in office.  Patient declined flu vaccine.  Patient is up-to-date with CRC screening, breast cancer screening, cervical cancer screening.  Patient was given information at discharge about preventative healthcare maintenance with anticipatory guidance.      Relevant Orders   CBC with Differential/Platelet   Comprehensive metabolic panel with GFR   TSH   Morbid obesity (HCC)   Pending to TSH, A1c, lipid panel.  Continue work on healthy lifestyle modifications.  Did encourage more exercise.  Did  discuss medical recommendations of exercise of 5 days a week at 30 minutes a time.      Relevant Orders   Hemoglobin A1c   TSH   Lipid panel   Fatigue   Ambiguous in nature will check TSH, CBC, A1c, vitamin B12, vitamin D .  Epworth Sleepiness Scale in  office of 6.  Consider sleep apnea testing if blood work benign      Relevant Orders   CBC with Differential/Platelet   Comprehensive metabolic panel with GFR   VITAMIN D  25 Hydroxy (Vit-D Deficiency, Fractures)   TSH   Vitamin B12   Snoring   Epworth Sleepiness Scale of 6 in office.      Other Visit Diagnoses       Screening for lipid disorders         Screening for diabetes mellitus       Relevant Orders   Hemoglobin A1c     Screening for bladder cancer       Relevant Orders   Urine Microscopic       Return in about 1 year (around 04/04/2025) for CPE and Labs.    Adina Crandall, NP  "

## 2024-04-04 NOTE — Assessment & Plan Note (Signed)
 Discussed age-appropriate immunizations and screening exams.  Did review patient's personal, surgical, social, family history.  Patient up-to-date with all age-appropriate vaccinations she would like.  Did discuss pneumonia and shingles vaccine in office.  Patient declined flu vaccine.  Patient is up-to-date with CRC screening, breast cancer screening, cervical cancer screening.  Patient was given information at discharge about preventative healthcare maintenance with anticipatory guidance.

## 2024-04-04 NOTE — Assessment & Plan Note (Signed)
History of the same pending vitamin D level today

## 2024-04-04 NOTE — Assessment & Plan Note (Signed)
 Ambiguous in nature will check TSH, CBC, A1c, vitamin B12, vitamin D .  Epworth Sleepiness Scale in office of 6.  Consider sleep apnea testing if blood work benign

## 2024-04-04 NOTE — Assessment & Plan Note (Signed)
 Epworth Sleepiness Scale of 6 in office.

## 2024-04-04 NOTE — Assessment & Plan Note (Signed)
 Epworth Sleepiness Scale in office.  Pending labs consider sleep study given morbid obesity

## 2024-04-04 NOTE — Patient Instructions (Signed)
Nice to see you today I will be in touch with the labs once I have them Follow up with me in 1 year, sooner If you need me  

## 2024-04-04 NOTE — Assessment & Plan Note (Signed)
 Pending to TSH, A1c, lipid panel.  Continue work on healthy lifestyle modifications.  Did encourage more exercise.  Did discuss medical recommendations of exercise of 5 days a week at 30 minutes a time.

## 2024-04-05 LAB — COMPREHENSIVE METABOLIC PANEL WITH GFR
ALT: 21 IU/L (ref 0–32)
AST: 19 IU/L (ref 0–40)
Albumin: 4 g/dL (ref 3.8–4.9)
Alkaline Phosphatase: 102 IU/L (ref 49–135)
BUN/Creatinine Ratio: 19 (ref 9–23)
BUN: 18 mg/dL (ref 6–24)
Bilirubin Total: 0.2 mg/dL (ref 0.0–1.2)
CO2: 23 mmol/L (ref 20–29)
Calcium: 8.8 mg/dL (ref 8.7–10.2)
Chloride: 105 mmol/L (ref 96–106)
Creatinine, Ser: 0.95 mg/dL (ref 0.57–1.00)
Globulin, Total: 2.3 g/dL (ref 1.5–4.5)
Glucose: 97 mg/dL (ref 70–99)
Potassium: 4.2 mmol/L (ref 3.5–5.2)
Sodium: 142 mmol/L (ref 134–144)
Total Protein: 6.3 g/dL (ref 6.0–8.5)
eGFR: 72 mL/min/1.73

## 2024-04-05 LAB — CBC WITH DIFFERENTIAL/PLATELET
Basophils Absolute: 0 x10E3/uL (ref 0.0–0.2)
Basos: 1 %
EOS (ABSOLUTE): 0.2 x10E3/uL (ref 0.0–0.4)
Eos: 3 %
Hematocrit: 38.8 % (ref 34.0–46.6)
Hemoglobin: 12.6 g/dL (ref 11.1–15.9)
Immature Grans (Abs): 0 x10E3/uL (ref 0.0–0.1)
Immature Granulocytes: 0 %
Lymphocytes Absolute: 1.2 x10E3/uL (ref 0.7–3.1)
Lymphs: 15 %
MCH: 29.1 pg (ref 26.6–33.0)
MCHC: 32.5 g/dL (ref 31.5–35.7)
MCV: 90 fL (ref 79–97)
Monocytes Absolute: 0.5 x10E3/uL (ref 0.1–0.9)
Monocytes: 6 %
Neutrophils Absolute: 5.8 x10E3/uL (ref 1.4–7.0)
Neutrophils: 75 %
Platelets: 313 x10E3/uL (ref 150–450)
RBC: 4.33 x10E6/uL (ref 3.77–5.28)
RDW: 12.7 % (ref 11.7–15.4)
WBC: 7.7 x10E3/uL (ref 3.4–10.8)

## 2024-04-05 LAB — LIPID PANEL
Chol/HDL Ratio: 3.7 ratio (ref 0.0–4.4)
Cholesterol, Total: 157 mg/dL (ref 100–199)
HDL: 43 mg/dL
LDL Chol Calc (NIH): 100 mg/dL — ABNORMAL HIGH (ref 0–99)
Triglycerides: 71 mg/dL (ref 0–149)
VLDL Cholesterol Cal: 14 mg/dL (ref 5–40)

## 2024-04-05 LAB — HEMOGLOBIN A1C
Est. average glucose Bld gHb Est-mCnc: 111 mg/dL
Hgb A1c MFr Bld: 5.5 % (ref 4.8–5.6)

## 2024-04-05 LAB — URINALYSIS, MICROSCOPIC ONLY
Bacteria, UA: NONE SEEN
Casts: NONE SEEN /LPF
RBC, Urine: NONE SEEN /HPF (ref 0–2)
WBC, UA: NONE SEEN /HPF (ref 0–5)

## 2024-04-05 LAB — VITAMIN D 25 HYDROXY (VIT D DEFICIENCY, FRACTURES): Vit D, 25-Hydroxy: 24.3 ng/mL — ABNORMAL LOW (ref 30.0–100.0)

## 2024-04-05 LAB — VITAMIN B12: Vitamin B-12: 640 pg/mL (ref 232–1245)

## 2024-04-05 LAB — TSH: TSH: 1.38 u[IU]/mL (ref 0.450–4.500)

## 2024-04-07 ENCOUNTER — Ambulatory Visit: Payer: Self-pay | Admitting: Nurse Practitioner

## 2025-01-29 ENCOUNTER — Ambulatory Visit
